# Patient Record
Sex: Male | Born: 1950 | Hispanic: No | Smoking: Former smoker
Health system: Southern US, Community
[De-identification: ages and names within clinical notes are randomized; demographics above are authoritative.]

## PROBLEM LIST (undated history)

## (undated) DIAGNOSIS — Z86010 Personal history of colonic polyps: Secondary | ICD-10-CM

## (undated) DIAGNOSIS — J45909 Unspecified asthma, uncomplicated: Secondary | ICD-10-CM

## (undated) DIAGNOSIS — J309 Allergic rhinitis, unspecified: Secondary | ICD-10-CM

## (undated) DIAGNOSIS — M1990 Unspecified osteoarthritis, unspecified site: Secondary | ICD-10-CM

## (undated) DIAGNOSIS — L309 Dermatitis, unspecified: Secondary | ICD-10-CM

## (undated) HISTORY — DX: Allergic rhinitis, unspecified: J30.9

## (undated) HISTORY — DX: Personal history of colonic polyps: Z86.010

## (undated) HISTORY — DX: Unspecified asthma, uncomplicated: J45.909

## (undated) HISTORY — PX: JOINT REPLACEMENT: SHX530

## (undated) HISTORY — PX: PROSTATECTOMY: SHX69

## (undated) HISTORY — DX: Dermatitis, unspecified: L30.9

## (undated) HISTORY — DX: Unspecified osteoarthritis, unspecified site: M19.90

---

## 1997-10-16 ENCOUNTER — Ambulatory Visit (HOSPITAL_COMMUNITY): Admission: RE | Admit: 1997-10-16 | Discharge: 1997-10-17 | Payer: Self-pay | Admitting: Sports Medicine

## 1997-10-30 ENCOUNTER — Ambulatory Visit (HOSPITAL_COMMUNITY): Admission: RE | Admit: 1997-10-30 | Discharge: 1997-10-30 | Payer: Self-pay | Admitting: Sports Medicine

## 1997-11-16 ENCOUNTER — Ambulatory Visit (HOSPITAL_COMMUNITY): Admission: RE | Admit: 1997-11-16 | Discharge: 1997-11-16 | Payer: Self-pay

## 1997-11-23 ENCOUNTER — Ambulatory Visit (HOSPITAL_COMMUNITY): Admission: RE | Admit: 1997-11-23 | Discharge: 1997-11-23 | Payer: Self-pay | Admitting: Sports Medicine

## 1998-06-02 ENCOUNTER — Encounter: Payer: Self-pay | Admitting: Emergency Medicine

## 1998-06-02 ENCOUNTER — Emergency Department (HOSPITAL_COMMUNITY): Admission: EM | Admit: 1998-06-02 | Discharge: 1998-06-02 | Payer: Self-pay | Admitting: Emergency Medicine

## 1999-08-09 ENCOUNTER — Ambulatory Visit (HOSPITAL_COMMUNITY): Admission: RE | Admit: 1999-08-09 | Discharge: 1999-08-09 | Payer: Self-pay | Admitting: *Deleted

## 2003-07-26 ENCOUNTER — Observation Stay (HOSPITAL_COMMUNITY): Admission: EM | Admit: 2003-07-26 | Discharge: 2003-07-27 | Payer: Self-pay | Admitting: Emergency Medicine

## 2003-08-05 ENCOUNTER — Observation Stay (HOSPITAL_COMMUNITY): Admission: EM | Admit: 2003-08-05 | Discharge: 2003-08-06 | Payer: Self-pay | Admitting: Emergency Medicine

## 2004-11-18 ENCOUNTER — Encounter: Admission: RE | Admit: 2004-11-18 | Discharge: 2004-11-18 | Payer: Self-pay | Admitting: Emergency Medicine

## 2005-04-23 ENCOUNTER — Encounter: Admission: RE | Admit: 2005-04-23 | Discharge: 2005-04-23 | Payer: Self-pay | Admitting: Emergency Medicine

## 2005-06-16 ENCOUNTER — Ambulatory Visit: Payer: Self-pay | Admitting: Gastroenterology

## 2005-07-24 ENCOUNTER — Ambulatory Visit: Payer: Self-pay | Admitting: Gastroenterology

## 2005-07-27 ENCOUNTER — Encounter (INDEPENDENT_AMBULATORY_CARE_PROVIDER_SITE_OTHER): Payer: Self-pay | Admitting: Specialist

## 2005-07-27 ENCOUNTER — Ambulatory Visit (HOSPITAL_COMMUNITY): Admission: RE | Admit: 2005-07-27 | Discharge: 2005-07-27 | Payer: Self-pay | Admitting: Gastroenterology

## 2005-08-25 ENCOUNTER — Ambulatory Visit: Payer: Self-pay | Admitting: Gastroenterology

## 2006-04-19 ENCOUNTER — Emergency Department (HOSPITAL_COMMUNITY): Admission: EM | Admit: 2006-04-19 | Discharge: 2006-04-20 | Payer: Self-pay | Admitting: Emergency Medicine

## 2006-04-20 ENCOUNTER — Inpatient Hospital Stay (HOSPITAL_COMMUNITY): Admission: EM | Admit: 2006-04-20 | Discharge: 2006-04-27 | Payer: Self-pay | Admitting: Emergency Medicine

## 2006-04-30 ENCOUNTER — Emergency Department (HOSPITAL_COMMUNITY): Admission: EM | Admit: 2006-04-30 | Discharge: 2006-05-01 | Payer: Self-pay | Admitting: *Deleted

## 2006-07-02 ENCOUNTER — Encounter: Admission: RE | Admit: 2006-07-02 | Discharge: 2006-07-02 | Payer: Self-pay | Admitting: Emergency Medicine

## 2006-07-13 ENCOUNTER — Ambulatory Visit (HOSPITAL_COMMUNITY): Admission: RE | Admit: 2006-07-13 | Discharge: 2006-07-13 | Payer: Self-pay | Admitting: Neurology

## 2006-10-05 ENCOUNTER — Emergency Department (HOSPITAL_COMMUNITY): Admission: EM | Admit: 2006-10-05 | Discharge: 2006-10-05 | Payer: Self-pay | Admitting: Emergency Medicine

## 2006-11-01 ENCOUNTER — Ambulatory Visit (HOSPITAL_COMMUNITY): Admission: RE | Admit: 2006-11-01 | Discharge: 2006-11-01 | Payer: Self-pay | Admitting: Emergency Medicine

## 2007-02-06 ENCOUNTER — Emergency Department (HOSPITAL_COMMUNITY): Admission: EM | Admit: 2007-02-06 | Discharge: 2007-02-06 | Payer: Self-pay | Admitting: Emergency Medicine

## 2007-02-10 ENCOUNTER — Encounter: Admission: RE | Admit: 2007-02-10 | Discharge: 2007-02-10 | Payer: Self-pay | Admitting: Emergency Medicine

## 2007-11-16 ENCOUNTER — Other Ambulatory Visit: Admission: RE | Admit: 2007-11-16 | Discharge: 2007-11-16 | Payer: Self-pay | Admitting: Otolaryngology

## 2008-03-28 ENCOUNTER — Encounter: Admission: RE | Admit: 2008-03-28 | Discharge: 2008-03-28 | Payer: Self-pay | Admitting: Emergency Medicine

## 2009-04-19 ENCOUNTER — Ambulatory Visit: Payer: Self-pay | Admitting: Internal Medicine

## 2009-04-19 DIAGNOSIS — M1990 Unspecified osteoarthritis, unspecified site: Secondary | ICD-10-CM

## 2009-04-19 DIAGNOSIS — J309 Allergic rhinitis, unspecified: Secondary | ICD-10-CM | POA: Insufficient documentation

## 2009-04-19 DIAGNOSIS — J45909 Unspecified asthma, uncomplicated: Secondary | ICD-10-CM

## 2009-04-19 DIAGNOSIS — Z860100 Personal history of colon polyps, unspecified: Secondary | ICD-10-CM

## 2009-04-19 HISTORY — DX: Personal history of colonic polyps: Z86.010

## 2009-04-19 HISTORY — DX: Personal history of colon polyps, unspecified: Z86.0100

## 2009-04-19 HISTORY — DX: Unspecified osteoarthritis, unspecified site: M19.90

## 2009-04-19 HISTORY — DX: Allergic rhinitis, unspecified: J30.9

## 2009-04-19 HISTORY — DX: Unspecified asthma, uncomplicated: J45.909

## 2009-04-19 LAB — CONVERTED CEMR LAB
ALT: 37 units/L (ref 0–53)
AST: 31 units/L (ref 0–37)
Albumin: 4 g/dL (ref 3.5–5.2)
Alkaline Phosphatase: 69 units/L (ref 39–117)
BUN: 9 mg/dL (ref 6–23)
Basophils Relative: 0.8 % (ref 0.0–3.0)
Chloride: 102 meq/L (ref 96–112)
Eosinophils Relative: 2.3 % (ref 0.0–5.0)
GFR calc non Af Amer: 81.28 mL/min (ref >60)
HCT: 50.8 % (ref 39.0–52.0)
HDL: 37.5 mg/dL — ABNORMAL LOW (ref >39.00)
Hemoglobin: 17.1 g/dL — ABNORMAL HIGH (ref 13.0–17.0)
Lymphs Abs: 2.1 10*3/uL (ref 0.7–4.0)
MCV: 92 fL (ref 78.0–100.0)
Monocytes Absolute: 0.5 10*3/uL (ref 0.1–1.0)
Monocytes Relative: 9.1 % (ref 3.0–12.0)
Neutro Abs: 2.6 10*3/uL (ref 1.4–7.7)
PSA: 3.54 ng/mL (ref 0.10–4.00)
Platelets: 289 10*3/uL (ref 150.0–400.0)
Potassium: 4.7 meq/L (ref 3.5–5.1)
RBC: 5.52 M/uL (ref 4.22–5.81)
Sodium: 140 meq/L (ref 135–145)
TSH: 0.32 microintl units/mL — ABNORMAL LOW (ref 0.35–5.50)
Total Protein: 8.4 g/dL — ABNORMAL HIGH (ref 6.0–8.3)
VLDL: 19.8 mg/dL (ref 0.0–40.0)
WBC: 5.3 10*3/uL (ref 4.5–10.5)

## 2009-04-29 ENCOUNTER — Telehealth: Payer: Self-pay | Admitting: Internal Medicine

## 2009-06-03 ENCOUNTER — Ambulatory Visit: Payer: Self-pay | Admitting: Internal Medicine

## 2009-06-03 DIAGNOSIS — M542 Cervicalgia: Secondary | ICD-10-CM

## 2009-09-05 ENCOUNTER — Ambulatory Visit: Payer: Self-pay | Admitting: Internal Medicine

## 2009-10-01 ENCOUNTER — Telehealth: Payer: Self-pay

## 2009-10-07 ENCOUNTER — Telehealth: Payer: Self-pay | Admitting: Internal Medicine

## 2010-02-20 ENCOUNTER — Ambulatory Visit: Payer: Self-pay | Admitting: Internal Medicine

## 2010-04-15 ENCOUNTER — Telehealth: Payer: Self-pay | Admitting: Internal Medicine

## 2010-06-09 ENCOUNTER — Telehealth: Payer: Self-pay | Admitting: Internal Medicine

## 2010-06-16 ENCOUNTER — Ambulatory Visit: Payer: Self-pay | Admitting: Internal Medicine

## 2010-06-16 ENCOUNTER — Encounter: Payer: Self-pay | Admitting: Internal Medicine

## 2010-06-16 DIAGNOSIS — M549 Dorsalgia, unspecified: Secondary | ICD-10-CM | POA: Insufficient documentation

## 2010-06-16 DIAGNOSIS — R079 Chest pain, unspecified: Secondary | ICD-10-CM

## 2010-07-31 NOTE — Assessment & Plan Note (Signed)
Summary: ?flu/njr   Vital Signs:  Patient profile:   60 year old male Weight:      219 pounds Temp:     100.5 degrees F oral BP sitting:   130 / 88  (right arm) Cuff size:   regular  Vitals Entered By: Duard Brady LPN (September 05, 2009 3:24 PM) CC: c/o productive cough, congestion, headache, body aches - on Z-pack rx'd by dentist 2 days ago - for cold sx and dental procedure. Is Patient Diabetic? No   CC:  c/o productive cough, congestion, headache, and body aches - on Z-pack rx'd by dentist 2 days ago - for cold sx and dental procedure.Marland Kitchen  History of Present Illness: 60 year old patient with a history of asthma.  He presents with a 4-day history of increasing chest congestion, cough, and sore throat.  No fever or chills.  He was seen by his dentist earlier in the week for a tooth extraction and started azithromycin yesterday.  No low wheezing or sputum production.  No shortness of breath  Preventive Screening-Counseling & Management  Alcohol-Tobacco     Smoking Status: current  Allergies: 1)  ! * Horse Serum  Past History:  Past Medical History: Reviewed history from 06/03/2009 and no changes required. Asthma Colonic polyps, hx of Osteoarthritis Allergic rhinitis h/o ETOH  h/o Tobacco  neck pain, with mild left C8 radiculopathy  Social History: Smoking Status:  current  Review of Systems       The patient complains of anorexia, hoarseness, and prolonged cough.  The patient denies fever, weight loss, weight gain, vision loss, decreased hearing, chest pain, syncope, dyspnea on exertion, peripheral edema, headaches, hemoptysis, abdominal pain, melena, hematochezia, severe indigestion/heartburn, hematuria, incontinence, genital sores, muscle weakness, suspicious skin lesions, transient blindness, difficulty walking, depression, unusual weight change, abnormal bleeding, enlarged lymph nodes, angioedema, breast masses, and testicular masses.    Physical Exam  General:   Well-developed,well-nourished,in no acute distress; alert,appropriate and cooperative throughout examination Head:  Normocephalic and atraumatic without obvious abnormalities. No apparent alopecia or balding. Eyes:  No corneal or conjunctival inflammation noted. EOMI. Perrla. Funduscopic exam benign, without hemorrhages, exudates or papilledema. Vision grossly normal. Ears:  External ear exam shows no significant lesions or deformities.  Otoscopic examination reveals clear canals, tympanic membranes are intact bilaterally without bulging, retraction, inflammation or discharge. Hearing is grossly normal bilaterally. Mouth:  pharyngeal erythema.   Neck:  No deformities, masses, or tenderness noted. Lungs:  a few scattered rhonchi.  No active wheezing.normal respiratory effort, no intercostal retractions, and no accessory muscle use.    O2 saturation 95% Heart:  Normal rate and regular rhythm. S1 and S2 normal without gallop, murmur, click, rub or other extra sounds. pulse rate 100 Abdomen:  Bowel sounds positive,abdomen soft and non-tender without masses, organomegaly or hernias noted. Msk:  No deformity or scoliosis noted of thoracic or lumbar spine.   Extremities:  No clubbing, cyanosis, edema, or deformity noted with normal full range of motion of all joints.     Impression & Recommendations:  Problem # 1:  ASTHMA (ICD-493.90)  His updated medication list for this problem includes:    Proair Hfa 108 (90 Base) Mcg/act Aers (Albuterol sulfate) ..... Use as needed  Problem # 2:  BRONCHITIS-ACUTE (ICD-466.0)  His updated medication list for this problem includes:    Proair Hfa 108 (90 Base) Mcg/act Aers (Albuterol sulfate) ..... Use as needed    Hydrocodone-homatropine 5-1.5 Mg/52ml Syrp (Hydrocodone-homatropine) .Marland Kitchen... 1 teaspoon every 6 hours  as needed for cough  Complete Medication List: 1)  Proair Hfa 108 (90 Base) Mcg/act Aers (Albuterol sulfate) .... Use as needed 2)  Naproxen 500 Mg  Tabs (Naproxen) .... As needed 3)  Hydroxyzine Hcl 25 Mg Tabs (Hydroxyzine hcl) .... As needed for itching 4)  Tramadol Hcl 50 Mg Tabs (Tramadol hcl) .... One every 6 hours as needed for pain 5)  Hydrocodone-homatropine 5-1.5 Mg/19ml Syrp (Hydrocodone-homatropine) .Marland Kitchen.. 1 teaspoon every 6 hours as needed for cough  Patient Instructions: 1)  Get plenty of rest, drink lots of clear liquids, and use Tylenol or Ibuprofen for fever and comfort. Return in 7-10 days if you're not better:sooner if you're feeling worse. in the 2)  Take your antibiotic as prescribed until ALL of it is gone, but stop if you develop a rash or swelling and contact our office as soon as possible. Prescriptions: HYDROCODONE-HOMATROPINE 5-1.5 MG/5ML SYRP (HYDROCODONE-HOMATROPINE) 1 teaspoon every 6 hours as needed for cough  #6 oz x 0   Entered and Authorized by:   Gordy Savers  MD   Signed by:   Gordy Savers  MD on 09/05/2009   Method used:   Print then Give to Patient   RxID:   [id number]

## 2010-07-31 NOTE — Progress Notes (Signed)
Summary: refill hydroxyzine  Phone Note Call from Patient   Caller: Patient  [phone] Reason for Call: Talk to Doctor Summary of Call: Pt called to req a refill on med: Hydroxyzine Hcl 25 Mg .... Pt adv that Rx can be sent to [facility] - [address]...Marland KitchenMarland KitchenPt adv that he sees Dr Kirtland Bouchard on a regular basis and only sees Dr Joseph Art intermittently / as needed.... Pt would like to see if Dr Kirtland Bouchard will start maintaining this med for him instead of pt having to go see a specialist and pay a higher co-pay each time he needs a refill on this med....[facility] is also closer to where the patient lives.  Can you advise?  Need for OV?  Pt can be reached at [phone] with any questions or concerns.  Initial call taken by: Debbra Riding,  October 07, 2009 11:51 AM  Follow-up for Phone Call        OK for RF  #100  25 mg Follow-up by: Gordy Savers  MD,  October 07, 2009 12:33 PM    Prescriptions: HYDROXYZINE HCL 25 MG TABS (HYDROXYZINE HCL) as needed for itching  #100 x 1   Entered by:   Duard Brady LPN   Authorized by:   Gordy Savers  MD   Signed by:   Duard Brady LPN on 30/86/5784   Method used:   Electronically to        [facility] [address] (retail)       [address]       [place], [place]  [address]       Ph: [phone]       Fax: [phone]   RxID:   [id number]  efilled to [facility] - attempt to call pt - ans mach - left msg. KIK

## 2010-07-31 NOTE — Assessment & Plan Note (Signed)
Summary: fup on meds//ccm   Vital Signs:  Newman profile:   60 year old male Weight:      219 pounds Temp:     98.2 degrees F oral BP sitting:   130 / 80  (left arm) Cuff size:   regular  Vitals Entered By: Kathrynn Speed CMA (February 20, 2010 3:24 PM) CC: fu up on meds, requesting Rx for Clalis & Triamcinolone other docs had prescribed in past,,src Is Newman Diabetic? No   CC:  fu up on meds, requesting Rx for Clalis & Triamcinolone other docs had prescribed in past, , and src.  History of Present Illness: [name] who is seen today for follow-up.  He has a history of asthma.  He has been tobacco free for 16 days and this has been quite stable.  catheter he is requesting a refill on his medications  Current Medications (verified): 1)  Proair Hfa 108 (90 Base) Mcg/act Aers (Albuterol Sulfate) .... Use As Needed 2)  Hydroxyzine Hcl 25 Mg Tabs (Hydroxyzine Hcl) .... As Needed For Itching  Allergies (verified): 1)  ! * Horse Serum  Past History:  Past Medical History: Asthma Colonic polyps, hx of Osteoarthritis Allergic rhinitis h/o ETOH  h/o Tobacco  neck pain, with mild left C8 radiculopathy eczema  Physical Exam  General:  overweight-appearing.  normal blood pressureoverweight-appearing.   Head:  Normocephalic and atraumatic without obvious abnormalities. No apparent alopecia or balding. Mouth:  Oral mucosa and oropharynx without lesions or exudates.  Teeth in good repair. Neck:  No deformities, masses, or tenderness noted. Lungs:  Normal respiratory effort, chest expands symmetrically. Lungs are clear to auscultation, no crackles or wheezes. Skin:  eczema stable   Impression & Recommendations:  Problem # 1:  NECK PAIN, LEFT (ICD-723.1) continues to have mild paresthesias  of all his left fourth and fifth fingers  Problem # 2:  ASTHMA (ICD-493.90)  His updated medication list for this problem includes:    Proair Hfa 108 (90 Base) Mcg/act Aers  (Albuterol sulfate) ..... Use as needed  His updated medication list for this problem includes:    Proair Hfa 108 (90 Base) Mcg/act Aers (Albuterol sulfate) ..... Use as needed  Complete Medication List: 1)  Proair Hfa 108 (90 Base) Mcg/act Aers (Albuterol sulfate) .... Use as needed 2)  Hydroxyzine Hcl 25 Mg Tabs (Hydroxyzine hcl) .... As needed for itching 3)  Triamcinolone Acetonide 0.1 % Crea (Triamcinolone acetonide) .... Used twice daily as directed 4)  Cialis 20 Mg Tabs (Tadalafil) .... One daily as directed  Newman Instructions: 1)  Please schedule a follow-up appointment in 6 months. 2)  Limit your Sodium (Salt) to less than 2 grams a day(slightly less than 1/2 a teaspoon) to prevent fluid retention, swelling, or worsening of symptoms. 3)  It is important that you exercise regularly at least 20 minutes 5 times a week. If you develop chest pain, have severe difficulty breathing, or feel very tired , stop exercising immediately and seek medical attention. Prescriptions: CIALIS 20 MG TABS (TADALAFIL) one daily as directed  #6 x 6   Entered and Authorized by:   Gordy Savers  MD   Signed by:   Gordy Savers  MD on 02/20/2010   Method used:   Print then Give to Newman   RxID:   [id number] TRIAMCINOLONE ACETONIDE 0.1 % CREA (TRIAMCINOLONE ACETONIDE) used twice daily as directed  #jar x 4   Entered and Authorized by:   Gordy Savers  MD   Signed by:   Gordy Savers  MD on 02/20/2010   Method used:   Print then Give to Newman   RxID:   [id number] PROAIR HFA 108 (90 BASE) MCG/ACT AERS (ALBUTEROL SULFATE) use as needed  #3 x 3   Entered and Authorized by:   Gordy Savers  MD   Signed by:   Gordy Savers  MD on 02/20/2010   Method used:   Print then Give to Newman   RxID:   [id number]

## 2010-07-31 NOTE — Miscellaneous (Signed)
Summary: med list update  Clinical Lists Changes  Medications: Removed medication of TRAMADOL HCL 50 MG TABS (TRAMADOL HCL) one every 6 hours as needed for pain Removed medication of NAPROXEN 500 MG TABS (NAPROXEN) as needed

## 2010-07-31 NOTE — Progress Notes (Signed)
Summary: Pt req TRIAMCINOLONE ACETONIDE oinment in large jar  Phone Note Call from Patient Call back at Home Phone [phone]   Caller: Patient Summary of Call: Pt says that the TRIAMCINOLONE ACETONIDE med, should be an ointment not a cream. Pt says oinment comes in large jar. Pls call in ointment to [facility] [phone] Initial call taken by: Lucy Antigua,  April 15, 2010 4:31 PM    Prescriptions: TRIAMCINOLONE ACETONIDE 0.1 % CREA (TRIAMCINOLONE ACETONIDE) used twice daily as directed  #jar x 2   Entered by:   Duard Brady LPN   Authorized by:   Gordy Savers  MD   Signed by:   Duard Brady LPN on 29/56/2130   Method used:   Faxed to ...       [facility]  [phone][facility] (retail)       [address] [place], [place]  [address]       Ph: [phone] or [phone]       Fax: [phone]   RxID:   [id number]

## 2010-07-31 NOTE — Assessment & Plan Note (Signed)
Summary: chest muscle discomfort/njr   Vital Signs:  Patient profile:   60 year old male Weight:      226 pounds Temp:     98.1 degrees F oral BP sitting:   130 / 80  (right arm) Cuff size:   large  Vitals Entered By: Duard Brady LPN (June 16, 2010 10:46 AM) CC: chest pain Is Patient Diabetic? No   CC:  chest pain.  History of Present Illness: 60 -year-old patient, who presents with a one-week history of pain is maximal just medial to his right scapula.  There is some radiation of the pain to the neck and anterior chest area.  Pain is aggravated by movement and he is especially bothered at night when his land supine.  He states he has had similar episodes since early adulthood.  He is a history of asthma, which has been stable  Allergies: 1)  ! * Horse Serum  Past History:  Past Medical History: Reviewed history from 02/20/2010 and no changes required. Asthma Colonic polyps, hx of Osteoarthritis Allergic rhinitis h/o ETOH  h/o Tobacco  neck pain, with mild left C8 radiculopathy eczema  Physical Exam  General:  Well-developed,well-nourished,in no acute distress; alert,appropriate and cooperative throughout examination Head:  Normocephalic and atraumatic without obvious abnormalities. No apparent alopecia or balding. Mouth:  Oral mucosa and oropharynx without lesions or exudates.  Teeth in good repair. Neck:  No deformities, masses, or tenderness noted. Lungs:  Normal respiratory effort, chest expands symmetrically. Lungs are clear to auscultation, no crackles or wheezes. Heart:  Normal rate and regular rhythm. S1 and S2 normal without gallop, murmur, click, rub or other extra sounds. Msk:  tenderness was noted along the needle border of the right scapula.  There did appear to be a soft tissue fullness.  Unclear whether this was related to muscle spasm or possibly a sebaceous cyst   Impression & Recommendations:  Problem # 1:  BACK PAIN, RIGHT  (ICD-724.5)  His updated medication list for this problem includes:    Cyclobenzaprine Hcl 10 Mg Tabs (Cyclobenzaprine hcl) ..... One every 8 hours as needed for pain    Hydrocodone-acetaminophen 5-500 Mg Tabs (Hydrocodone-acetaminophen) ..... One every 6 hours as needed for pain  His updated medication list for this problem includes:    Cyclobenzaprine Hcl 10 Mg Tabs (Cyclobenzaprine hcl) ..... One every 8 hours as needed for pain    Hydrocodone-acetaminophen 5-500 Mg Tabs (Hydrocodone-acetaminophen) ..... One every 6 hours as needed for pain  Problem # 2:  ASTHMA (ICD-493.90)  His updated medication list for this problem includes:    Proair Hfa 108 (90 Base) Mcg/act Aers (Albuterol sulfate) ..... Use as needed  His updated medication list for this problem includes:    Proair Hfa 108 (90 Base) Mcg/act Aers (Albuterol sulfate) ..... Use as needed  Complete Medication List: 1)  Proair Hfa 108 (90 Base) Mcg/act Aers (Albuterol sulfate) .... Use as needed 2)  Hydroxyzine Hcl 25 Mg Tabs (Hydroxyzine hcl) .... As needed for itching 3)  Triamcinolone Acetonide 0.1 % Oint (Triamcinolone acetonide) .... Use two times a day 4)  Cialis 20 Mg Tabs (Tadalafil) .... One daily as directed 5)  Cyclobenzaprine Hcl 10 Mg Tabs (Cyclobenzaprine hcl) .... One every 8 hours as needed for pain 6)  Hydrocodone-acetaminophen 5-500 Mg Tabs (Hydrocodone-acetaminophen) .... One every 6 hours as needed for pain  Other Orders: EKG w/ Interpretation (93000)  Patient Instructions: 1)  You may move around but avoid painful motions. Apply  heat  to sore area for 20 minutes 3-4 times a day for 2-3 days. 2)  Take 400-600mg  of Ibuprofen (Advil, Motrin) with food every 4-6 hours as needed for relief of pain or comfort of fever. 3)  Please schedule a follow-up appointment in 6 months for annual exam  Prescriptions: HYDROCODONE-ACETAMINOPHEN 5-500 MG TABS (HYDROCODONE-ACETAMINOPHEN) one every 6 hours as needed for pain   #30 x 0   Entered and Authorized by:   Gordy Savers  MD   Signed by:   Gordy Savers  MD on 06/16/2010   Method used:   Print then Give to Patient   RxID:   [id number] CYCLOBENZAPRINE HCL 10 MG TABS (CYCLOBENZAPRINE HCL) one every 8 hours as needed for pain  #30 x 0   Entered and Authorized by:   Gordy Savers  MD   Signed by:   Gordy Savers  MD on 06/16/2010   Method used:   Print then Give to Patient   RxID:   [id number]    Orders Added: 1)  EKG w/ Interpretation [93000] 2)  Est. Patient Level III [99242]  Appended Document: chest muscle discomfort/njr     Allergies: 1)  ! * Horse Serum   Complete Medication List: 1)  Proair Hfa 108 (90 Base) Mcg/act Aers (Albuterol sulfate) .... Use as needed 2)  Hydroxyzine Hcl 25 Mg Tabs (Hydroxyzine hcl) .... As needed for itching 3)  Triamcinolone Acetonide 0.1 % Oint (Triamcinolone acetonide) .... Use two times a day 4)  Cialis 20 Mg Tabs (Tadalafil) .... One daily as directed 5)  Cyclobenzaprine Hcl 10 Mg Tabs (Cyclobenzaprine hcl) .... One every 8 hours as needed for pain 6)  Hydrocodone-acetaminophen 5-500 Mg Tabs (Hydrocodone-acetaminophen) .... One every 6 hours as needed for pain  Other Orders: Depo- Medrol 80mg  (J1040) Admin of Therapeutic Inj  intramuscular or subcutaneous (68341)   Medication Administration  Injection # 1:    Medication: Depo- Medrol 80mg     Diagnosis: BACK PAIN, RIGHT (ICD-724.5)    Route: IM    Site: R deltoid    Exp Date: 09/2012    Lot #: [id number]    Mfr: Pharmacia    Patient tolerated injection without complications    Given by: Duard Brady LPN (June 16, 2010 11:41 AM)  Orders Added: 1)  Depo- Medrol 80mg  [J1040] 2)  Admin of Therapeutic Inj  intramuscular or subcutaneous [96222]

## 2010-07-31 NOTE — Progress Notes (Signed)
Summary: REQ FOR REFILL (Hydroxyzine) denied  Phone Note Call from Patient   Caller: Patient  [phone] Reason for Call: Refill Medication, Insurance Question Summary of Call: Pt called to req a refill on med: Hydroxyzine Hcl 25 Mg .... Pt adv that Rx can be sent to [facility] - [address]  Pt can be reached at [phone] with any questions or concerns.  Initial call taken by: Debbra Riding,  October 01, 2009 2:52 PM  Follow-up for Phone Call        called pt - Dr. Amador Cunas has never written for this med according to what I see. Not in office till Monday - can ask at that time. Pt indicates that Dr. Joseph Art wrote rx - will call him and try and get it filled. KIK Follow-up by: Duard Brady LPN,  October 01, 2009 3:08 PM

## 2010-07-31 NOTE — Progress Notes (Signed)
Summary: switch cream to ointment?  Phone Note From Pharmacy   Caller: [facility] Summary of Call: triamcinolone acetonide cream rx - pt requesting ointment   please advise Initial call taken by: Duard Brady LPN,  June 09, 2010 3:35 PM    New/Updated Medications: TRIAMCINOLONE ACETONIDE 0.1 % OINT (TRIAMCINOLONE ACETONIDE) use two times a day Prescriptions: TRIAMCINOLONE ACETONIDE 0.1 % OINT (TRIAMCINOLONE ACETONIDE) use two times a day  #large jar x 4   Entered by:   Duard Brady LPN   Authorized by:   Gordy Savers  MD   Signed by:   Duard Brady LPN on 16/03/9603   Method used:   [facility]axed to ...       [facility]  [phone]* (retail)       [address] [place], [place]  [address]       Ph: [phone] or [phone]       Fax: [phone]   RxID:   [id number]  OK  large jar

## 2010-08-14 ENCOUNTER — Telehealth: Payer: Self-pay | Admitting: Internal Medicine

## 2010-08-14 NOTE — Telephone Encounter (Signed)
PT. WANTS TO HAVE AN STD & HIV TEST ADDED TO HIS LABS FOR TOMM. PLEASE CALL HIM IF THIS WILL BE A PROBLEM OR TO CONFIRM

## 2010-08-14 NOTE — Telephone Encounter (Signed)
Ok to add HIV and RPR.

## 2010-08-15 ENCOUNTER — Other Ambulatory Visit (INDEPENDENT_AMBULATORY_CARE_PROVIDER_SITE_OTHER): Payer: 59 | Admitting: Internal Medicine

## 2010-08-15 DIAGNOSIS — Z0000 Encounter for general adult medical examination without abnormal findings: Secondary | ICD-10-CM

## 2010-08-15 LAB — BASIC METABOLIC PANEL
BUN: 10 mg/dL (ref 6–23)
Creatinine, Ser: 1 mg/dL (ref 0.4–1.5)
GFR: 84.82 mL/min (ref >60.00)
Glucose, Bld: 81 mg/dL (ref 70–99)
Potassium: 4.5 mEq/L (ref 3.5–5.1)

## 2010-08-15 LAB — CBC WITH DIFFERENTIAL/PLATELET
Basophils Absolute: 0 10*3/uL (ref 0.0–0.1)
HCT: 49.8 % (ref 39.0–52.0)
Lymphs Abs: 1.6 10*3/uL (ref 0.7–4.0)
Monocytes Absolute: 0.5 10*3/uL (ref 0.1–1.0)
Monocytes Relative: 9.4 % (ref 3.0–12.0)
Neutrophils Relative %: 56.3 % (ref 43.0–77.0)
Platelets: 244 10*3/uL (ref 150.0–400.0)
RDW: 14.6 % (ref 11.5–14.6)
WBC: 5.1 10*3/uL (ref 4.5–10.5)

## 2010-08-15 LAB — LIPID PANEL
Cholesterol: 190 mg/dL (ref 0–200)
LDL Cholesterol: 134 mg/dL — ABNORMAL HIGH (ref 0–99)
Total CHOL/HDL Ratio: 5
Triglycerides: 81 mg/dL (ref 0.0–149.0)
VLDL: 16.2 mg/dL (ref 0.0–40.0)

## 2010-08-15 LAB — POCT URINALYSIS DIPSTICK
Bilirubin, UA: NEGATIVE
Blood, UA: NEGATIVE
Ketones, UA: NEGATIVE
Protein, UA: NEGATIVE
pH, UA: 6

## 2010-08-15 LAB — HEPATIC FUNCTION PANEL
AST: 28 U/L (ref 0–37)
Bilirubin, Direct: 0.1 mg/dL (ref 0.0–0.3)
Total Bilirubin: 0.5 mg/dL (ref 0.3–1.2)

## 2010-08-16 LAB — CHLAMYDIA PROBE AMPLIFICATION, URINE: Chlamydia, Swab/Urine, PCR: NEGATIVE

## 2010-08-16 LAB — GC PROBE AMPLIFICATION, URINE: GC Probe Amp, Urine: NEGATIVE

## 2010-08-21 ENCOUNTER — Ambulatory Visit (INDEPENDENT_AMBULATORY_CARE_PROVIDER_SITE_OTHER): Payer: 59 | Admitting: Internal Medicine

## 2010-08-21 ENCOUNTER — Encounter: Payer: Self-pay | Admitting: Internal Medicine

## 2010-08-21 VITALS — BP 140/76 | HR 90 | Temp 98.3°F | Resp 16 | Ht 71.0 in | Wt 211.0 lb

## 2010-08-21 DIAGNOSIS — M1990 Unspecified osteoarthritis, unspecified site: Secondary | ICD-10-CM

## 2010-08-21 DIAGNOSIS — J309 Allergic rhinitis, unspecified: Secondary | ICD-10-CM

## 2010-08-21 DIAGNOSIS — Z0000 Encounter for general adult medical examination without abnormal findings: Secondary | ICD-10-CM

## 2010-08-21 DIAGNOSIS — J45909 Unspecified asthma, uncomplicated: Secondary | ICD-10-CM

## 2010-08-21 DIAGNOSIS — Z86010 Personal history of colonic polyps: Secondary | ICD-10-CM

## 2010-08-21 MED ORDER — TADALAFIL 20 MG PO TABS: 20.0000 mg | ORAL_TABLET | Freq: Every day | ORAL | Status: DC | PRN

## 2010-08-21 MED ORDER — HYDROCODONE-ACETAMINOPHEN 5-500 MG PO TABS: 1.0000 | ORAL_TABLET | Freq: Four times a day (QID) | ORAL | Status: DC | PRN

## 2010-08-21 MED ORDER — CYCLOBENZAPRINE HCL 10 MG PO TABS: 10.0000 mg | ORAL_TABLET | Freq: Three times a day (TID) | ORAL | Status: DC | PRN

## 2010-08-21 MED ORDER — ALBUTEROL SULFATE HFA 108 (90 BASE) MCG/ACT IN AERS: 2.0000 | INHALATION_SPRAY | Freq: Four times a day (QID) | RESPIRATORY_TRACT | Status: DC | PRN

## 2010-08-21 NOTE — Progress Notes (Signed)
  Subjective:    Patient ID: [name], male    DOB: 09-12-1950, 60 y.o.   MRN: [id number]  HPI   60 year old patient who is seen today for a preventive health examination. He is doing quite well he has history of asthma which has been stable. He has a history of colonic polyps and is due for followup colonoscopy this year. He is followed by Dr. Kinnie Scales other problems include mild osteoarthritis allergic rhinitis and also a history of ongoing tobacco use   Review of Systems  Constitutional: Negative for fever, chills, activity change, appetite change and fatigue.  HENT: Negative for hearing loss, ear pain, congestion, rhinorrhea, sneezing, mouth sores, trouble swallowing, neck pain, neck stiffness, dental problem, voice change, sinus pressure and tinnitus.   Eyes: Negative for photophobia, pain, redness and visual disturbance.  Respiratory: Negative for apnea, cough, choking, chest tightness, shortness of breath and wheezing.   Cardiovascular: Negative for chest pain, palpitations and leg swelling.  Gastrointestinal: Negative for nausea, vomiting, abdominal pain, diarrhea, constipation, blood in stool, abdominal distention, anal bleeding and rectal pain.  Genitourinary: Negative for dysuria, urgency, frequency, hematuria, flank pain, decreased urine volume, discharge, penile swelling, scrotal swelling, difficulty urinating, genital sores and testicular pain.  Musculoskeletal: Negative for myalgias, back pain, joint swelling, arthralgias and gait problem.  Skin: Negative for color change, rash and wound.  Neurological: Negative for dizziness, tremors, seizures, syncope, facial asymmetry, speech difficulty, weakness, light-headedness, numbness and headaches.  Hematological: Negative for adenopathy. Does not bruise/bleed easily.  Psychiatric/Behavioral: Negative for suicidal ideas, hallucinations, behavioral problems, confusion, sleep disturbance, self-injury, dysphoric mood, decreased concentration  and agitation. The patient is not nervous/anxious.        Objective:   Physical Exam  Constitutional: He appears well-developed and well-nourished.  HENT:  Head: Normocephalic and atraumatic.  Right Ear: External ear normal.  Left Ear: External ear normal.  Nose: Nose normal.  Mouth/Throat: Oropharynx is clear and moist.  Eyes: Conjunctivae and EOM are normal. Pupils are equal, round, and reactive to light. No scleral icterus.  Neck: Normal range of motion. Neck supple. No JVD present. No thyromegaly present.  Cardiovascular: Regular rhythm, normal heart sounds and intact distal pulses.  Exam reveals no gallop and no friction rub.   No murmur heard. Pulmonary/Chest: Effort normal and breath sounds normal. No respiratory distress. He has no wheezes. He has no rales. He exhibits no tenderness.  Abdominal: Soft. Bowel sounds are normal. He exhibits no distension and no mass. There is no tenderness.  Genitourinary: Rectum normal, prostate normal and penis normal. Guaiac negative stool.  Musculoskeletal: Normal range of motion. He exhibits no edema and no tenderness.  Lymphadenopathy:    He has no cervical adenopathy.  Neurological: He is alert. He has normal reflexes. No cranial nerve deficit. Coordination normal.  Skin: Skin is warm and dry. No rash noted.  Psychiatric: He has a normal mood and affect. His behavior is normal.          Assessment & Plan:   preventive health examination unremarkable  Ongoing tobacco use  Colonic polyps   Tobacco cessation discussed and encouraged. He will followup with Dr. Kinnie Scales for his colonoscopy return here in one year or when necessary

## 2010-08-21 NOTE — Patient Instructions (Signed)
Limit your sodium (Salt) intake   It is important that you exercise regularly, at least 20 minutes 3 to 4 times per week.  If you develop chest pain or shortness of breath seek  medical attention.  Schedule your colonoscopy to help detect colon cancer.   Return in one year for follow-up  Smoking tobacco is very bad for your health. You should stop smoking immediately.

## 2010-08-22 ENCOUNTER — Encounter: Payer: Self-pay | Admitting: Internal Medicine

## 2010-08-26 ENCOUNTER — Other Ambulatory Visit: Payer: Self-pay

## 2010-08-26 MED ORDER — TRIAMCINOLONE ACETONIDE 0.1 % EX OINT: TOPICAL_OINTMENT | Freq: Two times a day (BID) | CUTANEOUS | Status: DC

## 2010-11-14 NOTE — Discharge Summary (Signed)
NAMEABIGAIL, MARSIGLIA                ACCOUNT NO.:  [id number]   MEDICAL RECORD NO.:  [id number]          PATIENT TYPE:  INP   LOCATION:  3013                         FACILITY:  [facility]   PHYSICIAN:  Danae Orleans. Venetia Maxon, M.D.  DATE OF BIRTH:  Dec 13, 1950   DATE OF ADMISSION:  04/20/2006  DATE OF DISCHARGE:  04/27/2006                                 DISCHARGE SUMMARY   REASON FOR ADMISSION:  Fall with subdural hematoma and scalp laceration.   ADDITIONAL DIAGNOSES:  1. Alcohol abuse.  2. Generalized seizure.  3. Hypertension.  4. Persistent headache.  5. History of hepatitis C.  6. Asthma.   FINAL DIAGNOSES:  1. Fall with subdural hematoma and scalp laceration.  2. Alcohol abuse.  3. Generalized seizure.  4. Hypertension.  5. Persistent headache.  6. History of hepatitis C.  7. Asthma.   </   HISTORY OF PRESENT ILLNESS AND HOSPITAL COURSE:  [name] is a 60-year-  old man who came to the emergency room on April 20, 2006, having fallen  and hit his head.  A head CT demonstrated a right-sided subdural hematoma  which appeared to be acute, and he had a left-sided scalp laceration. While  in the emergency room, I witnessed generalized seizure which ceased when the  patient was given Ativan.  Though the patient denied it, his wife stated  that had a heavy alcohol use history and that he drinks up to a gallon of  gin every few days.   The patient was hypertensive in the emergency room with a blood pressure of  160/112.  He was admitted to the intensive care unit where he was observed  carefully, and repeat head CTs did not show any evidence of progression of  the size of the right-sided subdural hematoma.  The patient was loaded with  Dilantin in the emergency room and did not have any further seizure  activity.  He was given DT prophylaxis with multivitamins, folate and  thiamine and also was given Ativan for DT prophylaxis.  The patient  completed DT prophylaxis and was  improving but continued to complain of the  headaches.   He was given a short course of Decadron, which did not improve his headache,  and subsequently it was felt that, because he had persistently elevated  blood pressures including on October 29 a blood pressure of 141/94,  hospitalists consult was initiated with Dr. Sharon Seller who recommended that he  be treated for the elevated blood pressure and started him on  benazepril/hydrochlorothiazide 10/12.5 mg daily.  Additionally, I counseled  patient on the need for him to stop drinking alcohol and the importance of  preventing seizure and remaining off alcohol.  We also had a social work  consultation regarding this matter, and I also called and spoke with Dr.  Lorenz Coaster about this. I prescribed Percocet 5/325 one every 4-6 hours needed  for pain and also Dilantin 100 mg three times daily for seizure prophylaxis.  The patient was instructed not to engage in alcohol consumption and not to  drive a car.  He is to  follow up with Dr. Lorenz Coaster in 1 week for blood  pressure management and will follow up with me in 2 weeks with a head CT and  a Dilantin level in the office.      Danae Orleans. Venetia Maxon, M.D.  Electronically Signed     JDS/MEDQ  D:  04/27/2006  T:  04/27/2006  Job:  [id number]   cc:   Reuben Likes, M.D.

## 2010-11-14 NOTE — Discharge Summary (Signed)
NAMEBENJAMIN, [name]                            ACCOUNT NO.:  [id number]   MEDICAL RECORD NO.:  [id number]                   PATIENT TYPE:  INP   LOCATION:  3732                                 FACILITY:  [facility]   PHYSICIAN:  Eliseo Gum, M.D.                DATE OF BIRTH:  [birth year]   DATE OF ADMISSION:  08/04/2003  DATE OF DISCHARGE:  08/06/2003                                 DISCHARGE SUMMARY   DISCHARGE DIAGNOSES:  1. Atypical chest pain.  2. Lip lesion etiology unknown.  3. Elevated liver enzymes.  4. Alcohol abuse.   PAST MEDICAL HISTORY:  1. Eczema.  2. Asthma.   DISCHARGE MEDICATIONS:  1. Aspirin 81 mg p.o. daily.  2. Protonix 40 mg daily.  3. Zestril 25 mg q.6h. p.r.n. for itching.   FOLLOW UP APPOINTMENTS:  The patient will follow up on August 22, 2003  with __________ [facility] for Cardiolite stress test  The patient  will also call [facility] outpatient [facility] [phone] of an internal medicine  follow up appointment.   PROCEDURES:  CT of the chest with contrast media of the lower extremities on  August 05, 2003 showed no evidence of PE or other acute process, mild  bibasilar dependent atelectasis, otherwise clear.  No evidence of acute DVD  seen on the lower extremity CT.   CONSULTATIONS:  [facility] was consulted via the telephone.  The  patient will follow up with them as an outpatient.  They were not consulted  for inpatient treatment.   HISTORY OF PRESENT ILLNESS:  [name] is a 60 year old African-American  male with past medical history of eczema, asthma, tobacco and alcohol abuse  who presents complaining of substernal chest pain.  The chest pain started 2  days ago radiating to the back.  It was intermittent more of a supine  position associated with some shortness of breath, but he did have shortness  of breath chronically before this. He had 1 episode of vomiting 2 days ago  that may have started this pain, no blood was noted in  his vomitus.  He  denies cough.  The most exercise that he gets is walking approximately 1  mile to work.  He does not get chest pain with this exertion.   His cardiac risk factors include tobacco abuse and age, but he denies  hypertension, diabetes, or family history.  His cholesterol status is  unknown.   REVIEW OF SYSTEMS:  Positive for some cough, emesis as noted above, no  dysuria, edema, hematochezia, hematemesis, or melena were noted.   SOCIAL HISTORY:  The patient lives in [place] with his wife.  He smokes a  pack of cigarettes a day for the past 4 years.  He quit approximately 1 week  ago. He drinks about a fifth of gin about every 3 days which he has been  doing for about 3 months.  He graduated high school, has 2 years of college  and works at [employer].   FAMILY HISTORY:  His mom is 61 and is healthy.  His dad died of an acute MI  at age 61.  His dad also had diabetes and hypertension.   PHYSICAL EXAMINATION:  VITAL SIGNS:  Temperature 97.5, blood pressure  148/86, pulse 69.  Saturation 94% on room air.  Respirations 20.  GENERAL:  The patient is a well-developed black male sitting beside the bed  in no acute distress.  HEENT:  Pupils are equal round, reacted to light.  Extraocular muscles are  intact.  There is an oral lesion to the lower anterior mucosa which had a  light black area noted.  NECK:  Supple.  No lymphadenopathy.  CARDIOVASCULAR:  Regular rate and rhythm without appreciable murmur, rub, or  gallop.  LUNGS:  Decreased expiratory breath sounds and coarse.  ABDOMEN:  Soft, nontender.  Mild tenderness to palpation of the epigastrium.  Positive bowel sounds.  EXTREMITIES:  No clubbing, cyanosis, or edema with good pulses.   ADMISSION LABS:  Sodium 138, potassium 4, chloride 97, bicarb 24, BUN 15,  creatinine 1.1, glucose 127,  total bilirubin 0.9, AST and ALT were 55 and  42 respectively, alkaline phosphatase 57, albumin 3.7.  The first set of  enzymes  were negative.  Followed by 2 other negative sets of enzymes.  BNP  was less than 30.  Lipase was 26.  LDL was 145.  Triglyceride was 251 and  HDL was 48.  Chest x-ray was normal.  CT scan with results as above.   HOSPITAL COURSE BY PROBLEM:  Problem #1:  ATYPICAL CHEST PAIN.  The patient  was admitted for rule out myocardial infarction with cardiac enzymes.  Also  had a differential of gastritis, GERD, and PUD especially with his history  of alcohol use.  Pancreatitis was the lower end of the differential, but was  ruled out with a negative lipase.  A urine drug screen was checked which was  negative.  A TSH was checked which was normal at 3.019.  The patient was  started on Protonix for possible GI causes especially given his history of  worsening pain on eating.  Because the patient ruled out for a myocardial  infarction; it was decided that he would need further risk stratification  with a Cardiolite stress test. For this reason we called [facility]ology  and set up an outpatient appointment on February 23.  The patient will be  discharged on Protonix and aspirin.  We did check an H. pylori which is  pending.  We will need to follow up on this.   Problem #2:  LIP LESION.  The patient had painless lesion on his lip; and,  with his history of smoking, there was some concern for oral carcinoma and  leukoplakia.  It was decided that if it did not resolve over a period of  time the patient would need a biopsy to rule out cancer given his risk  factors.  This can be done as an outpatient, but it does need to be followed  up.   Problem #3:  ELEVATED LIVER ENZYMES.  When the patient arrived in the  hospital his enzymes were 55 and 42 for his AST and ALT, respectively, as  noted above. His alkaline phosphatase was 57 with a total bilirubin of 0.9.  Given his history of alcohol use, the main differential diagnosis was alcoholic hepatitis, versus viral hepatitis.  An acute viral hepatitis  panel  was checked; and the patient was found to be positive for hepatitis C.  For  this reason the patient will need a hepatitis A and hepatitis B vaccine and  will need to follow up in the Hepatology Clinic to see if he is eligible for  interferon and ribavirin treatment.   Problem #4:  ALCOHOL ABUSE.  The patient was counseled on his use of  alcohol.  He was given vitamins throughout his hospital stay.   DISCHARGE LABS:  Hemoglobin A1c 6.0, hepatitis acute viral panel positive  for hepatitis C.  TSH 3.019.  Sodium 137, potassium 3.7, chloride 103,  bicarb 28, glucose 100, BUN 15, creatinine 1.2, and calcium 8.7.  White  count 5.3, hemoglobin 14.6, platelets 300, lipase 26.  Cardiac enzymes were  negative.  Urine drug screen negative with a profile as above.  LDL 145, HDL  48, TG 251, alcohol less than 5, D-dimmer 0.79, BNP less than 30.                                                Eliseo Gum, M.D.    KC/MEDQ  D:  08/06/2003  T:  08/06/2003  Job:  [id number]

## 2010-11-14 NOTE — Consult Note (Signed)
[name], Newman                ACCOUNT NO.:  [id number]   MEDICAL RECORD NO.:  [id number]          PATIENT TYPE:  INP   LOCATION:  3013                         FACILITY:  [facility]   PHYSICIAN:  Lonia Blood, M.D.DATE OF BIRTH:  [birth year]   DATE OF CONSULTATION:  DATE OF DISCHARGE:                                   CONSULTATION   PRIMARY CARE PHYSICIAN:  Dr. Lorenz Coaster.   REQUESTING PHYSICIAN/ATTENDING:  Danae Orleans. Venetia Maxon, M.D.   REASON FOR CONSULTATION:  Uncontrolled hypertension.   HISTORY OF PRESENT ILLNESS:  [name] is a very pleasant 60 year old  gentleman who was admitted to the service of Dr. Venetia Maxon on April 20, 2006  after he was found to have a subdural hematoma.  The patient to me describes  an 8-week history of severe headaches.  He denies any trauma to the head.  He himself denies significant alcohol abuse. He reports when his headaches  began 8 weeks ago he attempted all over-the-counter pain medications to no  available.  He then reports that he began to drink alcohol in attempts to  ameliorate the pain.  He reports that prior to this he drank only  occasionally, and he vehemently denies an alcohol abuse problem.  Nonetheless, the patient's family noted that he has fallen many times and  struck his head, each time due to a drunken state per their report.  Nonetheless, at the time of his presentation, the patient had a witnessed  fall in his work place and was also noted to have seizure activity.  He was  transferred to the emergency room for evaluation.  He proceeded to seize in  front of the neurosurgeon during his ER evaluation.  He was admitted after  he was found to have a significant subdural hematoma.  Ongoing care has been  by the neurosurgical service.  The patient has improved nicely.  Approximately 36 hours ago, however, the patient experienced a significant  return of his headache.  Work-up has been ongoing by neurosurgery to include  an MRI of  the brain, which is currently pending.  It has also been  appreciated the patient has suffered with elevated blood pressure during his  hospital stay.  Neurosurgery has requested that we evaluate the patient in  hopes that we can address his elevated blood pressure and assist in control  of his problem.  At the time of this evaluation, the patient has been given  an oral pain medication/acetaminophen mixture, which he reports has almost  completely resolved his headache.  Blood pressure remains elevated.  He has  no other complaints at this time.   REVIEW OF SYSTEMS:  Comprehensive review of systems is entirely unremarkable  with the exception of the elements noted in the history of present illness  above.   PAST MEDICAL HISTORY:  1. Hepatitis C positive with no prior outpatient hepatology evaluation.  2. Tobacco abuse in the amount of 1 pack per day.  3. Alcohol abuse - per patient denies, but family reports a gallon of      gin every 2 days.  4. No prior diagnosis of hypertension.  5. Eczema.   OUTPATIENT MEDICATIONS:  The patient is currently on no outpatient  prescription medications.   ALLERGIES:  No known drug allergies.   FAMILY HISTORY:  The patient's mother is alive at 89 and health.  The  patient's father died at age 30 due to acute coronary syndrome and also had  diabetes and hypertension.   SOCIAL HISTORY:  The patient is married.  He lives with his wife.  He works  at [employer].  He has 2 years of college education.   DATA REVIEW:  Potassium is 3.5, which is increased from 3.1 previously.  Sodium, chloride, bicarbonate, BUN and creatinine are normal.  Serum glucose  is mildly elevated at 124, on dextrose containing fluids.  Calcium is 8.8.  CBC is unremarkable.  Coags are essentially normal.   PHYSICAL EXAMINATION:  VITAL SIGNS:  Temperature 98.4, blood pressure 141/94  with a range of 139-160 systolic over 80-100 diastolic over the last 48  hours.  Heart  rate is 65, respiratory rate is 20.  O2 saturations are 97% on  room air.  GENERAL:  Well-developed, well-nourished male in no acute respiratory  distress.  HEENT:  Normocephalic and atraumatic.  Pupils equal, round and reactive to  light and accommodation.  Extraocular muscles intact.  __________  clear.  NECK:  No JVD, no lymphadenopathy.  LUNGS:  Clear to auscultation bilaterally without wheezes or rhonchi.  CARDIOVASCULAR:  Regular rate and rhythm without murmur, gallop, or rub.  Normal S1 and S2.  ABDOMEN:  Nontender, nondistended, soft.  Bowel sounds present.  No  hepatosplenomegaly.  No rebound.  No ascites.  EXTREMITIES:  No significant clubbing, cyanosis, or edema in bilateral lower  extremities.  NEUROLOGIC:  Nonfocal neurologic exam.   RECOMMENDATIONS:  1. Uncontrolled blood pressure.  At this time, the patient's blood      pressure has been consistently elevated during his hospital stay.  I am      comfortable making a diagnosis of uncontrolled hypertension.  It is      quite possible this could be secondary to his ongoing headache and the      stress of acute hospitalization.  Nonetheless, at the present time, I      do not feel that we can afford to not treat his elevated diastolic and      systolic pressures.  I will initiate combined ACE inhibitor and      diuretic therapy.  The patient confirms to me that he does have      insurance that would allow him to obtain these medications on a regular      basis.  It is my recommendation that he be discharged on these same      medications, should he tolerate them.  He will need to follow with his      primary care physician in approximately 1 week.  I have informed the      patient is it quite possible that should his headaches resolve should      he discontinued alcohol use and return to his usual habits, that he     may, in fact, not require long-term blood pressure therapy.  A BMET      will need to be checked again in 1  week to assure the patient is      tolerating this combination therapy.  2. Mild hypokalemia.  The patient is suffering with a mild hypokalemia.  This is likely secondary to decreased p.o. intake during his hospital      stay.  Will correct this prior to initiating his diuretic and ACE      inhibitor therapy.  3. Alcohol abuse.  I have counseled the patient on the multiple      deleterious effects of ongoing alcohol abuse.  He vehemently denies use      of alcohol, as noted above.  Beyond the counseling that I have afforded      him, there is little more to offer if the patient continues to deny      that he has a problem.  This will need to be monitored by his family      and his outpatient physician.  4. Tobacco abuse.  I have advised the patient of the multiple deleterious      effects of ongoing tobacco use.  He reports to me that he will no      longer smoke.  I have congratulated him on this, and have encouraged      him to stick to this.   Thank you very much for your consultation of this gentleman.  We are happy  to follow along with you during this hospitalization.      Lonia Blood, M.D.  Electronically Signed     JTM/MEDQ  D:  04/26/2006  T:  04/26/2006  Job:  [id number]

## 2010-11-14 NOTE — Procedures (Signed)
CLINICAL INFORMATION:  This 60 year old black male has had two episodes of seizure activity and  has complained of severe headaches.  He has had previously 2 blood clots  on the brain which apparently are not present this time.   TECHNICAL DESCRIPTION:  This EEG was recorded during the awake state.  The background activity shows 12-13 Hz rhythms with higher amplitudes  seen in the posterior head regions bilaterally.  There is much low-  voltage fast beta activity present during portions of this EEG and also  muscle artifact as well.  No focal asymmetries are present.  There is no  evidence of any epileptiform activity, and there is no evidence of any  stage II sleep seen.  Photic stimulation was performed which did not  produce driving response.  Hyperventilation testing produced no  significant abnormalities.   IMPRESSION:  This is a normal EEG during the awake and drowsy states.           ______________________________  Genene Churn. Sandria Manly, M.D.     ZOX:WRUE  D:  07/13/2006 16:34:24  T:  07/14/2006 11:46:11  Job #:  [id number]

## 2010-11-14 NOTE — H&P (Signed)
NAMERONIT, MARCZAK                ACCOUNT NO.:  [id number]   MEDICAL RECORD NO.:  [id number]          PATIENT TYPE:  INP   LOCATION:  3112                         FACILITY:  [facility]   PHYSICIAN:  Danae Orleans. Venetia Maxon, M.D.  DATE OF BIRTH:  [birth year]   DATE OF ADMISSION:  04/20/2006  DATE OF DISCHARGE:                                HISTORY & PHYSICAL   REASON FOR ADMISSION:  Fall, in which the patient struck his head, had a  seizure, and was found to have a subdural hematoma.   HISTORY OF PRESENT ILLNESS:  [name] is a 60 year old right-handed man  with what he describes is a 96-hour history of headache, which he initially  thought was a sinus problem.  He came to the emergency room yesterday  evening and left before exam, secondary to a long wait.  He went back to  work; he works at [employer].  While at work, the patient had a twitching in  his left face, fell, and hit his head and had a left occipital scalp  laceration.  Head CT demonstrates a small right-sided acute to subacute  subdural hematoma.  In the emergency room, the patient was awake, alert, and  conversant, but then following his examination, he developed left-sided hand  twitching and numbness, which then moved up his left arm, ascended into the  body, and then he had a generalized seizure with loss of consciousness,  which I witnessed.  The seizure activity was broke with 2 mg of Ativan IV.   PAST MEDICAL HISTORY:  Significant for hepatitis C and asthma.  According to  the patient's wife, he is a heavy smoker and heavy drinker of alcoholic  beverages, and he drinks about a gallon of gin every few days.   ALLERGIES:  HE HAS NO KNOWN DRUG ALLERGIES.   MEDICATIONS:  He is on no scheduled medications.   PHYSICAL EXAMINATION:  VITAL SIGNS:  Temperature 99.4, pulse of 127, with a  respiratory rate of 24, saturation is 93%, and his blood pressure is  160/112.  GENERAL:  On initial examination, the patient is awake,  alert, and  conversant.  He speaks, states his name, his age, and events over the last  few days.  HEENT:  Pupils are equal, round, and reactive to light.  Extraocular  movements are intact.  He has a laceration of the tip of his tongue and his  left occiput, which is not actively bleeding.  Tympanic membranes are  negative.  EXTREMITIES:  He moves all extremities with full power and has no pronator  drift.  He has no numbness in his upper or lower extremities.  CHEST:  Clear to auscultation.  HEART:  Regular rate and rhythm without murmur.  ABDOMEN:  Obese with active bowel sounds, no hepatosplenomegaly appreciated.  EXTREMITIES:  No edema, clubbing, or cyanosis of the lower extremities.  Reflexes are symmetric in the upper and lower extremities without pathologic  reflexes.  NEUROLOGICAL:  During the examination, the patient had a generalized  seizure, which started in his left arm and moved to face and to  his body.   IMPRESSION:  [name] is a 60 year old man who is a heavy drinker and  smoker, with a headache and new onset of seizures and right-sided subdural  hematoma.  On further discussion, the patient does not recall any trauma to  his head; however, on further discussion with the patient's wife, she says  he becomes drunk and then will fall and hit his head and frequently falls  and strikes his head, and I suspect that putting everything together, he had  a fall, hit his head in which he developed a subdural, and subsequently  developed headaches, and then later, had a seizure from the subdural  hematoma, which resulted in his fall at work and laceration to his scalp.  The patient is to be admitted to the neuro intensive care unit to control  his seizures.  We will observe, make sure he does not have more problems  with hypertension.  He may require drainage of the subdural hematoma.  We  will need to repeat a head CT in the morning.  He will also need some  alcohol abuse  counseling and he will need Ativan protocol for DTs.      Danae Orleans. Venetia Maxon, M.D.  Electronically Signed     JDS/MEDQ  D:  04/20/2006  T:  04/20/2006  Job:  [id number]

## 2011-02-13 ENCOUNTER — Encounter: Payer: Self-pay | Admitting: Internal Medicine

## 2011-04-15 ENCOUNTER — Other Ambulatory Visit: Payer: Self-pay | Admitting: Internal Medicine

## 2011-09-28 ENCOUNTER — Other Ambulatory Visit: Payer: Self-pay | Admitting: Internal Medicine

## 2011-10-05 ENCOUNTER — Other Ambulatory Visit (INDEPENDENT_AMBULATORY_CARE_PROVIDER_SITE_OTHER): Payer: 59

## 2011-10-05 DIAGNOSIS — Z0000 Encounter for general adult medical examination without abnormal findings: Secondary | ICD-10-CM

## 2011-10-05 LAB — POCT URINALYSIS DIPSTICK
Blood, UA: NEGATIVE
Glucose, UA: NEGATIVE
Spec Grav, UA: >=1.03
Urobilinogen, UA: 0.2

## 2011-10-05 LAB — CBC WITH DIFFERENTIAL/PLATELET
Basophils Relative: 1.1 % (ref 0.0–3.0)
HCT: 48 % (ref 39.0–52.0)
Hemoglobin: 15.5 g/dL (ref 13.0–17.0)
Lymphocytes Relative: 36.3 % (ref 12.0–46.0)
Lymphs Abs: 2.1 10*3/uL (ref 0.7–4.0)
MCHC: 32.4 g/dL (ref 30.0–36.0)
Monocytes Relative: 11.8 % (ref 3.0–12.0)
Neutro Abs: 2.7 10*3/uL (ref 1.4–7.7)
RBC: 5.11 Mil/uL (ref 4.22–5.81)

## 2011-10-05 LAB — BASIC METABOLIC PANEL
CO2: 27 mEq/L (ref 19–32)
Calcium: 9.4 mg/dL (ref 8.4–10.5)
Potassium: 4.5 mEq/L (ref 3.5–5.1)
Sodium: 139 mEq/L (ref 135–145)

## 2011-10-05 LAB — HEPATIC FUNCTION PANEL
AST: 31 U/L (ref 0–37)
Albumin: 3.9 g/dL (ref 3.5–5.2)
Alkaline Phosphatase: 63 U/L (ref 39–117)
Total Protein: 7.5 g/dL (ref 6.0–8.3)

## 2011-10-05 LAB — LIPID PANEL
Cholesterol: 196 mg/dL (ref 0–200)
LDL Cholesterol: 133 mg/dL — ABNORMAL HIGH (ref 0–99)
Triglycerides: 101 mg/dL (ref 0.0–149.0)
VLDL: 20.2 mg/dL (ref 0.0–40.0)

## 2011-10-05 LAB — TSH: TSH: 0.67 u[IU]/mL (ref 0.35–5.50)

## 2011-10-05 LAB — PSA: PSA: 2.91 ng/mL (ref 0.10–4.00)

## 2011-10-06 LAB — HIV ANTIBODY (ROUTINE TESTING W REFLEX): HIV: NONREACTIVE

## 2011-10-12 ENCOUNTER — Ambulatory Visit (INDEPENDENT_AMBULATORY_CARE_PROVIDER_SITE_OTHER): Payer: 59 | Admitting: Internal Medicine

## 2011-10-12 ENCOUNTER — Encounter: Payer: Self-pay | Admitting: Internal Medicine

## 2011-10-12 VITALS — BP 124/78 | HR 76 | Temp 98.3°F | Resp 16 | Ht 71.0 in | Wt 212.0 lb

## 2011-10-12 DIAGNOSIS — J309 Allergic rhinitis, unspecified: Secondary | ICD-10-CM

## 2011-10-12 DIAGNOSIS — Z86010 Personal history of colonic polyps: Secondary | ICD-10-CM

## 2011-10-12 DIAGNOSIS — J45909 Unspecified asthma, uncomplicated: Secondary | ICD-10-CM

## 2011-10-12 DIAGNOSIS — Z0000 Encounter for general adult medical examination without abnormal findings: Secondary | ICD-10-CM

## 2011-10-12 MED ORDER — ALBUTEROL SULFATE HFA 108 (90 BASE) MCG/ACT IN AERS: 2.0000 | INHALATION_SPRAY | Freq: Four times a day (QID) | RESPIRATORY_TRACT | Status: DC | PRN

## 2011-10-12 MED ORDER — TADALAFIL 20 MG PO TABS: 20.0000 mg | ORAL_TABLET | Freq: Every day | ORAL | Status: DC | PRN

## 2011-10-12 NOTE — Patient Instructions (Signed)
It is important that you exercise regularly, at least 20 minutes 3 to 4 times per week.  If you develop chest pain or shortness of breath seek  medical attention.  Return in one year for follow-up   

## 2011-10-12 NOTE — Progress Notes (Signed)
Subjective:    Patient ID: [name], male    DOB: October 23, 1950, 61 y.o.   MRN: [id number]  HPI  61 year old patient who is seen today for a health maintenance exam. He is approximately 2 weeks from a hemorrhoidal banding which was quite uncomfortable. The pain has largely resolved as has the bleeding. He states it was a very uncomfortable procedure. He is status post colonoscopy about 6 months ago. Laboratory profile reviewed  Allergies (verified):  1) ! * Horse Serum   Past History:  Past Medical History:  Asthma  Colonic polyps, hx of  Osteoarthritis  Allergic rhinitis  h/o ETOH  h/o Tobacco   Past Surgical History:  status post left hip replacement surgery May 2010 at [facility]  colonoscopy 2008  Hemorrhoidal banding April 2013  Family History:  Reviewed history and no changes required.  father died age 9 history coronary artery disease, EtOH, hypertension, type 2 diabetes  mother age 44, in good health  Two brothers two sisters, unremarkable except for obesity   Social History:  Reviewed history and no changes required.  Divorced  two daughters, one son  history of EtOH but abstinent since December 2008  works at [employer]- attempting to discontinue tobaccoSmoking Status: quit < 6 months   Past Medical History  Diagnosis Date  . ALLERGIC RHINITIS 04/19/2009  . ASTHMA 04/19/2009  . COLONIC POLYPS, HX OF 04/19/2009  . OSTEOARTHRITIS 04/19/2009  . Eczema     History   Social History  . Marital Status: Legally Separated    Spouse Name: N/A    Number of Children: N/A  . Years of Education: N/A   Occupational History  . Not on file.   Social History Main Topics  . Smoking status: Former Smoker -- 1.0 packs/day    Quit date: 07/03/2011  . Smokeless tobacco: Never Used  . Alcohol Use: No  . Drug Use: No  . Sexually Active: Not on file   Other Topics Concern  . Not on file   Social History Narrative  . No narrative on file    Past Surgical  History  Procedure Date  . Joint replacement     left hip    No family history on file.  Allergies  Allergen Reactions  . Horse-Derived Products     Current Outpatient Prescriptions on File Prior to Visit  Medication Sig Dispense Refill  . cyclobenzaprine (FLEXERIL) 10 MG tablet Take 1 tablet (10 mg total) by mouth 3 (three) times daily as needed.  30 tablet  4  . HYDROcodone-acetaminophen (VICODIN) 5-500 MG per tablet Take 1 tablet by mouth every 6 (six) hours as needed.  30 tablet  3  . hydrOXYzine (ATARAX/VISTARIL) 25 MG tablet TAKE AS DIRECTED AS NEEDED FOR ITCHING  60 tablet  0  . PROAIR HFA 108 (90 BASE) MCG/ACT inhaler INHALE 2 PUFFS EVERY 6 HOURS AS NEEDED.  17 g  5  . tadalafil (CIALIS) 20 MG tablet Take 1 tablet (20 mg total) by mouth daily as needed.  10 tablet  6  . triamcinolone (KENALOG) 0.1 % cream APPLY TWICE DAILY AS DIRECTED  454 g  2  . triamcinolone (KENALOG) 0.1 % ointment Apply topically 2 (two) times daily.  454 g  4    BP 124/78  Pulse 76  Temp(Src) 98.3 F (36.8 C) (Oral)  Resp 16  Ht 5\' 11"  (1.803 m)  Wt 212 lb (96.163 kg)  BMI 29.57 kg/m2  SpO2 97%  Review of Systems  Constitutional: Negative for fever, chills, activity change, appetite change and fatigue.  HENT: Negative for hearing loss, ear pain, congestion, rhinorrhea, sneezing, mouth sores, trouble swallowing, neck pain, neck stiffness, dental problem, voice change, sinus pressure and tinnitus.   Eyes: Negative for photophobia, pain, redness and visual disturbance.  Respiratory: Negative for apnea, cough, choking, chest tightness, shortness of breath and wheezing.   Cardiovascular: Negative for chest pain, palpitations and leg swelling.  Gastrointestinal: Negative for nausea, vomiting, abdominal pain, diarrhea, constipation, blood in stool, abdominal distention, anal bleeding and rectal pain.  Genitourinary: Negative for dysuria, urgency, frequency, hematuria, flank pain, decreased  urine volume, discharge, penile swelling, scrotal swelling, difficulty urinating, genital sores and testicular pain.  Musculoskeletal: Negative for myalgias, back pain, joint swelling, arthralgias and gait problem.  Skin: Negative for color change, rash and wound.  Neurological: Negative for dizziness, tremors, seizures, syncope, facial asymmetry, speech difficulty, weakness, light-headedness, numbness and headaches.  Hematological: Negative for adenopathy. Does not bruise/bleed easily.  Psychiatric/Behavioral: Negative for suicidal ideas, hallucinations, behavioral problems, confusion, sleep disturbance, self-injury, dysphoric mood, decreased concentration and agitation. The patient is not nervous/anxious.        Objective:   Physical Exam  Constitutional: He appears well-developed and well-nourished.  HENT:  Head: Normocephalic and atraumatic.  Right Ear: External ear normal.  Left Ear: External ear normal.  Nose: Nose normal.  Mouth/Throat: Oropharynx is clear and moist.  Eyes: Conjunctivae and EOM are normal. Pupils are equal, round, and reactive to light. No scleral icterus.  Neck: Normal range of motion. Neck supple. No JVD present. No thyromegaly present.  Cardiovascular: Regular rhythm, normal heart sounds and intact distal pulses.  Exam reveals no gallop and no friction rub.   No murmur heard. Pulmonary/Chest: Effort normal and breath sounds normal. He exhibits no tenderness.  Abdominal: Soft. Bowel sounds are normal. He exhibits no distension and no mass. There is no tenderness.  Genitourinary: Penis normal.  Musculoskeletal: Normal range of motion. He exhibits no edema and no tenderness.  Lymphadenopathy:    He has no cervical adenopathy.  Neurological: He is alert. He has normal reflexes. No cranial nerve deficit. Coordination normal.  Skin: Skin is warm and dry. No rash noted.  Psychiatric: He has a normal mood and affect. His behavior is normal.          Assessment  & Plan:   Preventive health exam History of suppressed TSH. Now normal Asthma stable History of colonic polyps. Status post colonoscopy 2012

## 2011-10-19 ENCOUNTER — Other Ambulatory Visit: Payer: Self-pay | Admitting: Internal Medicine

## 2011-12-15 ENCOUNTER — Ambulatory Visit (INDEPENDENT_AMBULATORY_CARE_PROVIDER_SITE_OTHER): Payer: 59 | Admitting: Internal Medicine

## 2011-12-15 ENCOUNTER — Encounter: Payer: Self-pay | Admitting: Internal Medicine

## 2011-12-15 VITALS — BP 110/76 | Temp 98.3°F | Wt 211.0 lb

## 2011-12-15 DIAGNOSIS — K219 Gastro-esophageal reflux disease without esophagitis: Secondary | ICD-10-CM

## 2011-12-15 DIAGNOSIS — J45909 Unspecified asthma, uncomplicated: Secondary | ICD-10-CM

## 2011-12-15 MED ORDER — DEXLANSOPRAZOLE 60 MG PO CPDR: 60.0000 mg | DELAYED_RELEASE_CAPSULE | Freq: Every day | ORAL | Status: DC

## 2011-12-15 MED ORDER — BUDESONIDE 180 MCG/ACT IN AEPB: 1.0000 | INHALATION_SPRAY | Freq: Two times a day (BID) | RESPIRATORY_TRACT | Status: DC

## 2011-12-15 NOTE — Progress Notes (Signed)
  Subjective:    Patient ID: [name], male    DOB: [birth year], 61 y.o.   MRN: [id number]  HPI  61 year old patient who presents with a one-week history of sense of choking. He states that he has had some swallowing difficulties intermittently with both liquids and solids. Symptoms seem worse at night. Associated symptoms include nausea and occasional belching of gastric contents. No significant dyspepsia. No fever or constitutional complaints. No weight loss He has a history of asthma and states that he is using albuterol on the average of 6 times daily do to tightness in the chest. Denies any real active wheezing.    Review of Systems  Constitutional: Negative for fever, chills, appetite change and fatigue.  HENT: Negative for hearing loss, ear pain, congestion, sore throat, trouble swallowing, neck stiffness, dental problem, voice change and tinnitus.   Eyes: Negative for pain, discharge and visual disturbance.  Respiratory: Positive for choking, chest tightness and shortness of breath. Negative for cough, wheezing and stridor.   Cardiovascular: Negative for chest pain, palpitations and leg swelling.  Gastrointestinal: Negative for nausea, vomiting, abdominal pain, diarrhea, constipation, blood in stool and abdominal distention.  Genitourinary: Negative for urgency, hematuria, flank pain, discharge, difficulty urinating and genital sores.  Musculoskeletal: Negative for myalgias, back pain, joint swelling, arthralgias and gait problem.  Skin: Negative for rash.  Neurological: Negative for dizziness, syncope, speech difficulty, weakness, numbness and headaches.  Hematological: Negative for adenopathy. Does not bruise/bleed easily.  Psychiatric/Behavioral: Negative for behavioral problems and dysphoric mood. The patient is not nervous/anxious.        Objective:   Physical Exam  Constitutional: He is oriented to person, place, and time. He appears well-developed.  HENT:  Head:  Normocephalic.  Right Ear: External ear normal.  Left Ear: External ear normal.  Eyes: Conjunctivae and EOM are normal.  Neck: Normal range of motion.  Cardiovascular: Normal rate and normal heart sounds.   Pulmonary/Chest: Breath sounds normal.  Abdominal: Bowel sounds are normal.  Musculoskeletal: Normal range of motion. He exhibits no edema and no tenderness.  Neurological: He is alert and oriented to person, place, and time.  Psychiatric: He has a normal mood and affect. His behavior is normal.          Assessment & Plan:   GERD. Believe  this explains his symptom complex. We'll place on PPI therapy (DEXILANT) and started aggressive anti-reflux regimen. If unimproved will need EDG with Dr. Kinnie Scales Asthma. We'll give a trial of PULMICORT  for 2 months

## 2011-12-15 NOTE — Patient Instructions (Signed)
Avoids foods high in acid such as tomatoes citrus juices, and spicy foods.  Avoid eating within two hours of lying down or before exercising.  Do not overheat.  Try smaller more frequent meals.  If symptoms persist, elevate the head of her bed 12 inches while sleeping.  Call or return to clinic prn if these symptoms worsen or fail to improve as anticipated. Diet for Gastroesophageal Reflux Disease, Child Some children have small, brief episodes of reflux. Reflux (acid reflux) is when acid from your stomach flows up into the esophagus. When acid comes in contact with the esophagus, the acid causes irritation and soreness (inflammation) in the esophagus. The reflux may be so small that a child may not notice it. When reflux happens often or so severely that it causes damage to the esophagus, it is called gastroesophageal reflux disease (GERD). Nutrition therapy can help ease the discomfort of GERD.   FOODS TO AVOID    Caffeinated and decaffeinated coffee and black tea.   Regular or low-calorie carbonated beverages or energy drinks (caffeine-free carbonated beverages are allowed).   Strong spices, such as black pepper, white pepper, red pepper, cayenne, curry powder, and chili powder.   Peppermint or spearmint.   Chocolate.   High-fat foods, including meats and fried foods. Extra added fats including oils, butter, salad dressings, and nuts. Low-fat foods may not be recommended for children less than 57 years of age. Discuss this with your doctor or dietitian.   Fruits and vegetables that are not tolerated, such as citrus fruits and tomatoes.   Any food that seems to aggravate the child's condition.  If you have questions regarding your child's diet, call your caregiver or a registered dietician. OTHER THINGS THAT MAY HELP GERD INCLUDE:  Having the child eat his or her meals slowly, in a relaxed setting.   Serving several small meals throughout the day instead of 3 large meals.   Eliminating  food for a period of time if it causes distress.   Not letting the child lie down immediately after eating a meal.   Keeping the head of the child's bed raised 6 to 9 inches (15 to 23 cm) by using a foam wedge or blocks under the legs of the bed.   Encouraging the child to be physically active. Weight loss may be helpful in reducing reflux in overweight or obese children.   Having the child wear loose-fitting clothing.   Avoiding the use of tobacco in parents and caregivers. Secondhand smoke may aggravate symptoms in children with reflux.  SAMPLE MEAL PLAN This is a sample meal plan for a 35 to 67 year old child and is approximately 1200 calories based on https://www.bern[facility].org/ meal planning guidelines.   Breakfast   cup cooked oatmeal.    cup strawberries.    cup low-fat milk.  Snack   cup cucumber slices.   4 oz yogurt (made from low-fat milk).  Lunch  1 slice whole-wheat bread.   1 oz chicken.    cup blueberries.    cup snap peas.  Snack  3 whole-wheat crackers.   1 oz string cheese.  Dinner   cup brown rice.    cup mixed veggies.   1 cup low-fat milk.   2 oz grilled fish.  Document Released: 11/01/2006 Document Revised: 06/04/2011 Document Reviewed: 05/07/2011 [facility] Patient Information 2012 [place], [place].Gastroesophageal Reflux Disease, Adult Gastroesophageal reflux disease (GERD) happens when acid from your stomach flows up into the esophagus. When acid comes in contact with  the esophagus, the acid causes soreness (inflammation) in the esophagus. Over time, GERD may create small holes (ulcers) in the lining of the esophagus. CAUSES    Increased body weight. This puts pressure on the stomach, making acid rise from the stomach into the esophagus.   Smoking. This increases acid production in the stomach.   Drinking alcohol. This causes decreased pressure in the lower esophageal sphincter (valve or ring of muscle between the esophagus and stomach),  allowing acid from the stomach into the esophagus.   Late evening meals and a full stomach. This increases pressure and acid production in the stomach.   A malformed lower esophageal sphincter.  Sometimes, no cause is found. SYMPTOMS    Burning pain in the lower part of the mid-chest behind the breastbone and in the mid-stomach area. This may occur twice a week or more often.   Trouble swallowing.   Sore throat.   Dry cough.   Asthma-like symptoms including chest tightness, shortness of breath, or wheezing.  DIAGNOSIS   Your caregiver may be able to diagnose GERD based on your symptoms. In some cases, X-rays and other tests may be done to check for complications or to check the condition of your stomach and esophagus. TREATMENT   Your caregiver may recommend over-the-counter or prescription medicines to help decrease acid production. Ask your caregiver before starting or adding any new medicines.   HOME CARE INSTRUCTIONS    Change the factors that you can control. Ask your caregiver for guidance concerning weight loss, quitting smoking, and alcohol consumption.   Avoid foods and drinks that make your symptoms worse, such as:   Caffeine or alcoholic drinks.   Chocolate.   Peppermint or mint flavorings.   Garlic and onions.   Spicy foods.   Citrus fruits, such as oranges, lemons, or limes.   Tomato-based foods such as sauce, chili, salsa, and pizza.   Fried and fatty foods.   Avoid lying down for the 3 hours prior to your bedtime or prior to taking a nap.   Eat small, frequent meals instead of large meals.   Wear loose-fitting clothing. Do not wear anything tight around your waist that causes pressure on your stomach.   Raise the head of your bed 6 to 8 inches with wood blocks to help you sleep. Extra pillows will not help.   Only take over-the-counter or prescription medicines for pain, discomfort, or fever as directed by your caregiver.   Do not take aspirin,  ibuprofen, or other nonsteroidal anti-inflammatory drugs (NSAIDs).  SEEK IMMEDIATE MEDICAL CARE IF:    You have pain in your arms, neck, jaw, teeth, or back.   Your pain increases or changes in intensity or duration.   You develop nausea, vomiting, or sweating (diaphoresis).   You develop shortness of breath, or you faint.   Your vomit is green, yellow, black, or looks like coffee grounds or blood.   Your stool is red, bloody, or black.  These symptoms could be signs of other problems, such as heart disease, gastric bleeding, or esophageal bleeding. MAKE SURE YOU:    Understand these instructions.   Will watch your condition.   Will get help right away if you are not doing well or get worse.  Document Released: 03/25/2005 Document Revised: 06/04/2011 Document Reviewed: 01/02/2011 [facility] Patient Information 2012 [place], [place].

## 2012-01-27 ENCOUNTER — Telehealth: Payer: Self-pay | Admitting: Internal Medicine

## 2012-01-27 MED ORDER — CYCLOBENZAPRINE HCL 10 MG PO TABS: 10.0000 mg | ORAL_TABLET | Freq: Three times a day (TID) | ORAL | Status: AC | PRN

## 2012-01-27 NOTE — Telephone Encounter (Signed)
Spoke with pt- informed of rx and dr. Vernon Prey instructions

## 2012-01-27 NOTE — Telephone Encounter (Signed)
Last seen 12/15/11 Please advise - no muscle relaxer on med list

## 2012-01-27 NOTE — Telephone Encounter (Signed)
PT thinks he has a pulled muscle in arm says Dr. Kirtland Bouchard has seen him before for this and wanted to know if he could get some meds with out being seen? Please call him.

## 2012-01-27 NOTE — Telephone Encounter (Signed)
Take Aleve 200 mg twice daily for pain or swelling  Okay to call in a prescription for generic Flexeril 10 mg #30 to take 1 3 times a day when necessary he

## 2012-04-04 ENCOUNTER — Ambulatory Visit (INDEPENDENT_AMBULATORY_CARE_PROVIDER_SITE_OTHER): Payer: 59 | Admitting: Internal Medicine

## 2012-04-04 ENCOUNTER — Encounter: Payer: Self-pay | Admitting: Internal Medicine

## 2012-04-04 VITALS — BP 132/80 | Temp 98.0°F | Wt 213.0 lb

## 2012-04-04 DIAGNOSIS — R079 Chest pain, unspecified: Secondary | ICD-10-CM

## 2012-04-04 DIAGNOSIS — M549 Dorsalgia, unspecified: Secondary | ICD-10-CM

## 2012-04-04 MED ORDER — HYDROCODONE-ACETAMINOPHEN 5-500 MG PO TABS: 1.0000 | ORAL_TABLET | Freq: Four times a day (QID) | ORAL | Status: DC | PRN

## 2012-04-04 NOTE — Progress Notes (Signed)
Subjective:    Patient ID: [name], male    DOB: 02/22/1951, 61 y.o.   MRN: [id number]  HPI  61 year old patient who has a history of asthma and osteoarthritis. For the past week he has had some right posterior mid back pain described as a sharp sensation with some radiation to the right anterior chest area. This is more of a burning quality pain is aggravated by deep inspiration. Denies any fever cough or wheezing. His asthma has been stable.  Past Medical History  Diagnosis Date  . ALLERGIC RHINITIS 04/19/2009  . ASTHMA 04/19/2009  . COLONIC POLYPS, HX OF 04/19/2009  . OSTEOARTHRITIS 04/19/2009  . Eczema     History   Social History  . Marital Status: Legally Separated    Spouse Name: N/A    Number of Children: N/A  . Years of Education: N/A   Occupational History  . Not on file.   Social History Main Topics  . Smoking status: Former Smoker -- 1.0 packs/day    Quit date: 07/03/2011  . Smokeless tobacco: Never Used  . Alcohol Use: No  . Drug Use: No  . Sexually Active: Not on file   Other Topics Concern  . Not on file   Social History Narrative  . No narrative on file    Past Surgical History  Procedure Date  . Joint replacement     left hip    No family history on file.  Allergies  Allergen Reactions  . Horse-Derived Products     Current Outpatient Prescriptions on File Prior to Visit  Medication Sig Dispense Refill  . albuterol (PROAIR HFA) 108 (90 BASE) MCG/ACT inhaler Inhale 2 puffs into the lungs every 6 (six) hours as needed for wheezing.  17 g  5  . budesonide (PULMICORT) 180 MCG/ACT inhaler Inhale 1 puff into the lungs 2 (two) times daily.  1 Inhaler  4  . tadalafil (CIALIS) 20 MG tablet Take 1 tablet (20 mg total) by mouth daily as needed.  10 tablet  6  . triamcinolone (KENALOG) 0.1 % cream APPLY TWICE DAILY AS DIRECTED  454 g  2  . triamcinolone (KENALOG) 0.1 % ointment Apply topically 2 (two) times daily.  454 g  4  .  HYDROcodone-acetaminophen (VICODIN) 5-500 MG per tablet Take 1 tablet by mouth every 6 (six) hours as needed.  30 tablet  3  . hydrOXYzine (ATARAX/VISTARIL) 25 MG tablet TAKE AS DIRECTED AS NEEDED FOR ITCHING  60 tablet  0  . DISCONTD: dexlansoprazole (DEXILANT) 60 MG capsule Take 1 capsule (60 mg total) by mouth daily.  60 capsule  2    BP 132/80  Temp 98 F (36.7 C) (Oral)  Wt 213 lb (96.616 kg)       Review of Systems  Constitutional: Negative for fever, chills, appetite change and fatigue.  HENT: Negative for hearing loss, ear pain, congestion, sore throat, trouble swallowing, neck stiffness, dental problem, voice change and tinnitus.   Eyes: Negative for pain, discharge and visual disturbance.  Respiratory: Negative for cough, chest tightness, wheezing and stridor.   Cardiovascular: Positive for chest pain. Negative for palpitations and leg swelling.  Gastrointestinal: Negative for nausea, vomiting, abdominal pain, diarrhea, constipation, blood in stool and abdominal distention.  Genitourinary: Negative for urgency, hematuria, flank pain, discharge, difficulty urinating and genital sores.  Musculoskeletal: Positive for back pain. Negative for myalgias, joint swelling, arthralgias and gait problem.  Skin: Negative for rash.  Neurological: Negative for dizziness, syncope, speech difficulty, weakness,  numbness and headaches.  Hematological: Negative for adenopathy. Does not bruise/bleed easily.  Psychiatric/Behavioral: Negative for behavioral problems and dysphoric mood. The patient is not nervous/anxious.        Objective:   Physical Exam  Constitutional: He is oriented to person, place, and time. He appears well-developed.  HENT:  Head: Normocephalic.  Right Ear: External ear normal.  Left Ear: External ear normal.  Eyes: Conjunctivae normal and EOM are normal.  Neck: Normal range of motion.  Cardiovascular: Normal rate and normal heart sounds.   Pulmonary/Chest: Effort  normal and breath sounds normal. No respiratory distress. He has no wheezes. He has no rales. He exhibits tenderness.       O2 saturation 97  Tenderness right lateral chest wall area  Abdominal: Bowel sounds are normal.  Musculoskeletal: Normal range of motion. He exhibits no edema and no tenderness.  Neurological: He is alert and oriented to person, place, and time.  Psychiatric: He has a normal mood and affect. His behavior is normal.          Assessment & Plan:   Chest wall pain. Will continue maintenance medications we'll refill on hydrocodone and short-term Asthma stable

## 2012-04-04 NOTE — Patient Instructions (Addendum)
Call or return to clinic prn if these symptoms worsen or fail to improve as anticipated.   Take Aleve 200 mg twice daily for pain or swelling 

## 2012-04-15 ENCOUNTER — Other Ambulatory Visit: Payer: Self-pay | Admitting: Internal Medicine

## 2012-04-22 ENCOUNTER — Other Ambulatory Visit: Payer: Self-pay | Admitting: Internal Medicine

## 2012-08-26 ENCOUNTER — Other Ambulatory Visit: Payer: Self-pay | Admitting: Internal Medicine

## 2012-10-06 ENCOUNTER — Other Ambulatory Visit (INDEPENDENT_AMBULATORY_CARE_PROVIDER_SITE_OTHER): Payer: 59

## 2012-10-06 DIAGNOSIS — Z0000 Encounter for general adult medical examination without abnormal findings: Secondary | ICD-10-CM

## 2012-10-06 LAB — POCT URINALYSIS DIPSTICK
Blood, UA: NEGATIVE
Glucose, UA: NEGATIVE
Nitrite, UA: NEGATIVE
Protein, UA: NEGATIVE
Spec Grav, UA: 1.025
Urobilinogen, UA: 0.2
pH, UA: 5.5

## 2012-10-06 LAB — BASIC METABOLIC PANEL
BUN: 12 mg/dL (ref 6–23)
Calcium: 9.3 mg/dL (ref 8.4–10.5)
Creatinine, Ser: 1.1 mg/dL (ref 0.4–1.5)
GFR: 73.51 mL/min (ref >60.00)
Glucose, Bld: 85 mg/dL (ref 70–99)

## 2012-10-06 LAB — CBC WITH DIFFERENTIAL/PLATELET
Basophils Relative: 1.2 % (ref 0.0–3.0)
Eosinophils Absolute: 0.2 10*3/uL (ref 0.0–0.7)
Eosinophils Relative: 3.6 % (ref 0.0–5.0)
HCT: 46.9 % (ref 39.0–52.0)
Hemoglobin: 15.6 g/dL (ref 13.0–17.0)
Lymphs Abs: 2.2 10*3/uL (ref 0.7–4.0)
MCHC: 33.4 g/dL (ref 30.0–36.0)
MCV: 93.5 fl (ref 78.0–100.0)
Monocytes Absolute: 0.6 10*3/uL (ref 0.1–1.0)
Neutro Abs: 1.9 10*3/uL (ref 1.4–7.7)
Neutrophils Relative %: 38.3 % — ABNORMAL LOW (ref 43.0–77.0)
RBC: 5.01 Mil/uL (ref 4.22–5.81)

## 2012-10-06 LAB — TSH: TSH: 0.4 u[IU]/mL (ref 0.35–5.50)

## 2012-10-06 LAB — HEPATIC FUNCTION PANEL
ALT: 39 U/L (ref 0–53)
AST: 37 U/L (ref 0–37)
Albumin: 3.7 g/dL (ref 3.5–5.2)
Alkaline Phosphatase: 67 U/L (ref 39–117)

## 2012-10-06 LAB — LIPID PANEL: Cholesterol: 206 mg/dL — ABNORMAL HIGH (ref 0–200)

## 2012-10-13 ENCOUNTER — Ambulatory Visit (INDEPENDENT_AMBULATORY_CARE_PROVIDER_SITE_OTHER): Payer: 59 | Admitting: Internal Medicine

## 2012-10-13 ENCOUNTER — Encounter: Payer: Self-pay | Admitting: Internal Medicine

## 2012-10-13 VITALS — BP 140/80 | HR 73 | Temp 98.5°F | Resp 18 | Ht 69.75 in | Wt 204.0 lb

## 2012-10-13 DIAGNOSIS — Z0000 Encounter for general adult medical examination without abnormal findings: Secondary | ICD-10-CM

## 2012-10-13 DIAGNOSIS — J309 Allergic rhinitis, unspecified: Secondary | ICD-10-CM

## 2012-10-13 DIAGNOSIS — R972 Elevated prostate specific antigen [PSA]: Secondary | ICD-10-CM

## 2012-10-13 DIAGNOSIS — J45909 Unspecified asthma, uncomplicated: Secondary | ICD-10-CM

## 2012-10-13 MED ORDER — MONTELUKAST SODIUM 10 MG PO TABS: 10.0000 mg | ORAL_TABLET | Freq: Every day | ORAL | Status: DC

## 2012-10-13 MED ORDER — ALBUTEROL SULFATE HFA 108 (90 BASE) MCG/ACT IN AERS: 2.0000 | INHALATION_SPRAY | Freq: Four times a day (QID) | RESPIRATORY_TRACT | Status: DC | PRN

## 2012-10-13 MED ORDER — FLUTICASONE PROPIONATE 50 MCG/ACT NA SUSP: 2.0000 | Freq: Every day | NASAL | Status: DC

## 2012-10-13 MED ORDER — BUDESONIDE 180 MCG/ACT IN AEPB: 1.0000 | INHALATION_SPRAY | Freq: Two times a day (BID) | RESPIRATORY_TRACT | Status: DC

## 2012-10-13 NOTE — Patient Instructions (Signed)
Return in 6 months for follow-up  and a repeat PSA determination

## 2012-10-13 NOTE — Progress Notes (Signed)
Subjective:    Patient ID: [name], male    DOB: December 19, 1950, 62 y.o.   MRN: [id number]  HPI 62 year old patient who is seen today for a wellness exam. He has a history of asthma and allergic rhinitis. His asthma has been quite stable but he has considerable sinus symptoms. He is quite sensitive to perfumes dust as well his typical allergens. He does exercise regularly and uses the albuterol prophylactically for exercise-induced symptoms  Past Medical History  Diagnosis Date  . ALLERGIC RHINITIS 04/19/2009  . ASTHMA 04/19/2009  . COLONIC POLYPS, HX OF 04/19/2009  . OSTEOARTHRITIS 04/19/2009  . Eczema     History   Social History  . Marital Status: Legally Separated    Spouse Name: N/A    Number of Children: N/A  . Years of Education: N/A   Occupational History  . Not on file.   Social History Main Topics  . Smoking status: Former Smoker -- 1.00 packs/day    Quit date: 07/03/2011  . Smokeless tobacco: Never Used  . Alcohol Use: No  . Drug Use: No  . Sexually Active: Not on file   Other Topics Concern  . Not on file   Social History Narrative  . No narrative on file    Past Surgical History  Procedure Laterality Date  . Joint replacement      left hip    No family history on file.  Allergies  Allergen Reactions  . Horse-Derived Products     Current Outpatient Prescriptions on File Prior to Visit  Medication Sig Dispense Refill  . albuterol (PROAIR HFA) 108 (90 BASE) MCG/ACT inhaler Inhale 2 puffs into the lungs every 6 (six) hours as needed for wheezing.  17 g  5  . budesonide (PULMICORT) 180 MCG/ACT inhaler Inhale 1 puff into the lungs 2 (two) times daily.  1 Inhaler  4  . cyclobenzaprine (FLEXERIL) 10 MG tablet Take 10 mg by mouth 3 (three) times daily as needed.      . hydrOXYzine (ATARAX/VISTARIL) 25 MG tablet TAKE AS DIRECTED AS NEEDED FOR ITCHING  60 tablet  0  . tadalafil (CIALIS) 20 MG tablet Take 1 tablet (20 mg total) by mouth daily as needed.   10 tablet  6  . triamcinolone cream (KENALOG) 0.1 % APPLY TWICE DAILY AS DIRECTED  454 g  3  . triamcinolone ointment (KENALOG) 0.1 % APPLY TWICE A DAY  454 g  0   No current facility-administered medications on file prior to visit.    BP 140/80  Pulse 73  Temp(Src) 98.5 F (36.9 C) (Oral)  Resp 18  Ht 5' 9.75" (1.772 m)  Wt 204 lb (92.534 kg)  BMI 29.47 kg/m2  SpO2 96%         Review of Systems  Constitutional: Negative for fever, chills, activity change, appetite change and fatigue.  HENT: Positive for congestion. Negative for hearing loss, ear pain, rhinorrhea, sneezing, mouth sores, trouble swallowing, neck pain, neck stiffness, dental problem, voice change, sinus pressure and tinnitus.   Eyes: Positive for redness and itching. Negative for photophobia, pain and visual disturbance.  Respiratory: Negative for apnea, cough, choking, chest tightness, shortness of breath and wheezing.   Cardiovascular: Negative for chest pain, palpitations and leg swelling.  Gastrointestinal: Negative for nausea, vomiting, abdominal pain, diarrhea, constipation, blood in stool, abdominal distention, anal bleeding and rectal pain.  Genitourinary: Negative for dysuria, urgency, frequency, hematuria, flank pain, decreased urine volume, discharge, penile swelling, scrotal swelling, difficulty urinating, genital  sores and testicular pain.  Musculoskeletal: Negative for myalgias, back pain, joint swelling, arthralgias and gait problem.  Skin: Negative for color change, rash and wound.  Neurological: Negative for dizziness, tremors, seizures, syncope, facial asymmetry, speech difficulty, weakness, light-headedness, numbness and headaches.  Hematological: Negative for adenopathy. Does not bruise/bleed easily.  Psychiatric/Behavioral: Negative for suicidal ideas, hallucinations, behavioral problems, confusion, sleep disturbance, self-injury, dysphoric mood, decreased concentration and agitation. The patient  is not nervous/anxious.        Objective:   Physical Exam  Constitutional: He appears well-developed and well-nourished.  HENT:  Head: Normocephalic and atraumatic.  Right Ear: External ear normal.  Left Ear: External ear normal.  Nose: Nose normal.  Mouth/Throat: Oropharynx is clear and moist.  Eyes: Conjunctivae and EOM are normal. Pupils are equal, round, and reactive to light. No scleral icterus.  Neck: Normal range of motion. Neck supple. No JVD present. No thyromegaly present.  Cardiovascular: Regular rhythm, normal heart sounds and intact distal pulses.  Exam reveals no gallop and no friction rub.   No murmur heard. Pulmonary/Chest: Effort normal and breath sounds normal. He has no wheezes. He exhibits no tenderness.  Abdominal: Soft. Bowel sounds are normal. He exhibits no distension and no mass. There is no tenderness.  Genitourinary: Prostate normal and penis normal.  Musculoskeletal: Normal range of motion. He exhibits no edema and no tenderness.  Lymphadenopathy:    He has no cervical adenopathy.  Neurological: He is alert. He has normal reflexes. No cranial nerve deficit. Coordination normal.  Skin: Skin is warm and dry. No rash noted.  Psychiatric: He has a normal mood and affect. His behavior is normal.          Assessment & Plan:  Preventive health examination Asthma Allergic rhinitis  We'll give a two-month trial of Singulair. We'll also place on fluticasone. Manus as her medications will be continued Recheck in 6 months with repeat PSA at that time

## 2012-11-11 ENCOUNTER — Other Ambulatory Visit: Payer: Self-pay | Admitting: Internal Medicine

## 2013-02-10 ENCOUNTER — Telehealth: Payer: Self-pay | Admitting: Internal Medicine

## 2013-02-10 MED ORDER — HYDROXYZINE HCL 25 MG PO TABS: ORAL_TABLET | ORAL | Status: DC

## 2013-02-10 NOTE — Telephone Encounter (Signed)
Pt notified Rx sent to pharmacy as requested. 

## 2013-02-10 NOTE — Telephone Encounter (Signed)
Pt is requesting a refill of his hydrOXYzine (ATARAX/VISTARIL) 25 MG tablet be sent to [facility] on [address]. He states that he called the pharmacy on Tuesday 8/12 to request this RX, but they must not have sent it over. Please assist.

## 2013-04-13 ENCOUNTER — Other Ambulatory Visit: Payer: Self-pay | Admitting: Internal Medicine

## 2013-04-13 ENCOUNTER — Encounter: Payer: Self-pay | Admitting: Internal Medicine

## 2013-04-13 ENCOUNTER — Ambulatory Visit (INDEPENDENT_AMBULATORY_CARE_PROVIDER_SITE_OTHER): Payer: 59 | Admitting: Internal Medicine

## 2013-04-13 VITALS — BP 140/86 | HR 73 | Temp 98.0°F | Resp 20 | Wt 206.0 lb

## 2013-04-13 DIAGNOSIS — J309 Allergic rhinitis, unspecified: Secondary | ICD-10-CM

## 2013-04-13 DIAGNOSIS — R972 Elevated prostate specific antigen [PSA]: Secondary | ICD-10-CM

## 2013-04-13 DIAGNOSIS — Z23 Encounter for immunization: Secondary | ICD-10-CM

## 2013-04-13 DIAGNOSIS — J45909 Unspecified asthma, uncomplicated: Secondary | ICD-10-CM

## 2013-04-13 MED ORDER — TRIAMCINOLONE ACETONIDE 0.1 % EX CREA: TOPICAL_CREAM | CUTANEOUS | Status: DC

## 2013-04-13 MED ORDER — TADALAFIL 20 MG PO TABS: ORAL_TABLET | ORAL | Status: DC

## 2013-04-13 NOTE — Progress Notes (Signed)
Subjective:    Patient ID: [name], male    DOB: March 26, 1951, 62 y.o.   MRN: [id number]  HPI  62 year old patient who has a history of asthma allergic rhinitis. This has been stable. 6 months ago he was seen for his annual health assessment and PSA was mildly elevated. They're quite well without concerns or complaints. Continues to exercise regularly.  Past Medical History  Diagnosis Date  . ALLERGIC RHINITIS 04/19/2009  . ASTHMA 04/19/2009  . COLONIC POLYPS, HX OF 04/19/2009  . OSTEOARTHRITIS 04/19/2009  . Eczema     History   Social History  . Marital Status: Legally Separated    Spouse Name: N/A    Number of Children: N/A  . Years of Education: N/A   Occupational History  . Not on file.   Social History Main Topics  . Smoking status: Former Smoker -- 1.00 packs/day    Quit date: 07/03/2011  . Smokeless tobacco: Never Used  . Alcohol Use: No  . Drug Use: No  . Sexual Activity: Not on file   Other Topics Concern  . Not on file   Social History Narrative  . No narrative on file    Past Surgical History  Procedure Laterality Date  . Joint replacement      left hip    No family history on file.  Allergies  Allergen Reactions  . Horse-Derived Products     Current Outpatient Prescriptions on File Prior to Visit  Medication Sig Dispense Refill  . albuterol (PROAIR HFA) 108 (90 BASE) MCG/ACT inhaler Inhale 2 puffs into the lungs every 6 (six) hours as needed for wheezing.  17 g  5  . budesonide (PULMICORT) 180 MCG/ACT inhaler Inhale 1 puff into the lungs 2 (two) times daily.  1 Inhaler  4  . cyclobenzaprine (FLEXERIL) 10 MG tablet Take 10 mg by mouth 3 (three) times daily as needed.      . fluticasone (FLONASE) 50 MCG/ACT nasal spray Place 2 sprays into the nose daily.  16 g  6  . hydrOXYzine (ATARAX/VISTARIL) 25 MG tablet TAKE AS DIRECTED AS NEEDED FOR ITCHING  60 tablet  1  . montelukast (SINGULAIR) 10 MG tablet Take 1 tablet (10 mg total) by mouth at  bedtime.  30 tablet  3   No current facility-administered medications on file prior to visit.    BP 140/86  Pulse 73  Temp(Src) 98 F (36.7 C) (Oral)  Resp 20  Wt 206 lb (93.441 kg)  BMI 29.76 kg/m2  SpO2 98%       Review of Systems  Constitutional: Negative for fever, chills, appetite change and fatigue.  HENT: Negative for congestion, dental problem, ear pain, hearing loss, sore throat, tinnitus, trouble swallowing and voice change.   Eyes: Negative for pain, discharge and visual disturbance.  Respiratory: Negative for cough, chest tightness, wheezing and stridor.   Cardiovascular: Negative for chest pain, palpitations and leg swelling.  Gastrointestinal: Negative for nausea, vomiting, abdominal pain, diarrhea, constipation, blood in stool and abdominal distention.  Genitourinary: Negative for urgency, hematuria, flank pain, discharge, difficulty urinating and genital sores.  Musculoskeletal: Negative for arthralgias, back pain, gait problem, joint swelling, myalgias and neck stiffness.  Skin: Negative for rash.  Neurological: Negative for dizziness, syncope, speech difficulty, weakness, numbness and headaches.  Hematological: Negative for adenopathy. Does not bruise/bleed easily.  Psychiatric/Behavioral: Negative for behavioral problems and dysphoric mood. The patient is not nervous/anxious.        Objective:   Physical Exam  Constitutional: He is oriented to person, place, and time. He appears well-developed.  HENT:  Head: Normocephalic.  Right Ear: External ear normal.  Left Ear: External ear normal.  Eyes: Conjunctivae and EOM are normal.  Neck: Normal range of motion.  Cardiovascular: Normal rate and normal heart sounds.   Pulmonary/Chest: Breath sounds normal.  Abdominal: Bowel sounds are normal.  Musculoskeletal: Normal range of motion. He exhibits no edema and no tenderness.  Neurological: He is alert and oriented to person, place, and time.  Psychiatric: He  has a normal mood and affect. His behavior is normal.          Assessment & Plan:   Asthma/allergic rhinitis. Stable we'll continue a maintenance medication History of elevated PSA. We'll repeat; if still elevated we'll set up for urological evaluation  CPX 6 months

## 2013-04-13 NOTE — Patient Instructions (Signed)
It is important that you exercise regularly, at least 20 minutes 3 to 4 times per week.  If you develop chest pain or shortness of breath seek  medical attention.  Return in 6 months for follow-up  

## 2013-04-17 ENCOUNTER — Encounter: Payer: Self-pay | Admitting: Internal Medicine

## 2013-05-09 ENCOUNTER — Telehealth: Payer: Self-pay | Admitting: Internal Medicine

## 2013-05-09 NOTE — Telephone Encounter (Signed)
Pt had appt to see urologist today. Pt had to reschedule due to no copay. Pt is reschedule to 12/8. Pt would like to drop off urine sample to make sure he does not have any infection. Pt was referred to urologist for elevated PSA. Please advise

## 2013-05-10 NOTE — Telephone Encounter (Signed)
Spoke to pt asked him if having any symptoms like burning or pain with urination? Pt stated no just thought he could do one because Urology wanted a urine with visit and his visit was rescheduled. Told pt as long as you are not having symptoms okay to wait till appt with urology they will do a urine at visit. Pt verbalized understanding.

## 2013-07-03 ENCOUNTER — Ambulatory Visit: Payer: 59

## 2013-07-03 ENCOUNTER — Institutional Professional Consult (permissible substitution): Payer: 59 | Admitting: Radiation Oncology

## 2013-07-06 ENCOUNTER — Other Ambulatory Visit: Payer: Self-pay | Admitting: Urology

## 2013-08-08 ENCOUNTER — Inpatient Hospital Stay (HOSPITAL_COMMUNITY): Admission: RE | Admit: 2013-08-08 | Payer: 59 | Source: Ambulatory Visit

## 2013-08-11 ENCOUNTER — Encounter (HOSPITAL_COMMUNITY): Admission: RE | Payer: Self-pay | Source: Ambulatory Visit

## 2013-08-11 ENCOUNTER — Inpatient Hospital Stay (HOSPITAL_COMMUNITY): Admission: RE | Admit: 2013-08-11 | Payer: 59 | Source: Ambulatory Visit | Admitting: Urology

## 2013-08-11 SURGERY — ROBOTIC ASSISTED LAPAROSCOPIC RADICAL PROSTATECTOMY
Anesthesia: General | Laterality: Bilateral

## 2013-08-20 ENCOUNTER — Other Ambulatory Visit: Payer: Self-pay | Admitting: Internal Medicine

## 2013-09-16 ENCOUNTER — Emergency Department (HOSPITAL_COMMUNITY): Payer: 59

## 2013-09-16 ENCOUNTER — Encounter (HOSPITAL_COMMUNITY): Payer: Self-pay | Admitting: Emergency Medicine

## 2013-09-16 ENCOUNTER — Emergency Department (HOSPITAL_COMMUNITY)
Admission: EM | Admit: 2013-09-16 | Discharge: 2013-09-16 | Disposition: A | Discharge status: Home / Self Care | Payer: 59 | Attending: Emergency Medicine | Admitting: Emergency Medicine

## 2013-09-16 DIAGNOSIS — Z860100 Personal history of colon polyps, unspecified: Secondary | ICD-10-CM | POA: Insufficient documentation

## 2013-09-16 DIAGNOSIS — Y836 Removal of other organ (partial) (total) as the cause of abnormal reaction of the patient, or of later complication, without mention of misadventure at the time of the procedure: Secondary | ICD-10-CM | POA: Insufficient documentation

## 2013-09-16 DIAGNOSIS — Z87891 Personal history of nicotine dependence: Secondary | ICD-10-CM | POA: Insufficient documentation

## 2013-09-16 DIAGNOSIS — Z79899 Other long term (current) drug therapy: Secondary | ICD-10-CM | POA: Insufficient documentation

## 2013-09-16 DIAGNOSIS — L259 Unspecified contact dermatitis, unspecified cause: Secondary | ICD-10-CM | POA: Insufficient documentation

## 2013-09-16 DIAGNOSIS — IMO0002 Reserved for concepts with insufficient information to code with codable children: Secondary | ICD-10-CM | POA: Insufficient documentation

## 2013-09-16 DIAGNOSIS — R369 Urethral discharge, unspecified: Secondary | ICD-10-CM | POA: Insufficient documentation

## 2013-09-16 DIAGNOSIS — M1990 Unspecified osteoarthritis, unspecified site: Secondary | ICD-10-CM | POA: Insufficient documentation

## 2013-09-16 DIAGNOSIS — N4889 Other specified disorders of penis: Secondary | ICD-10-CM | POA: Insufficient documentation

## 2013-09-16 DIAGNOSIS — J45909 Unspecified asthma, uncomplicated: Secondary | ICD-10-CM | POA: Insufficient documentation

## 2013-09-16 LAB — BASIC METABOLIC PANEL
BUN: 21 mg/dL (ref 6–23)
CHLORIDE: 94 meq/L — AB (ref 96–112)
CO2: 28 mEq/L (ref 19–32)
Calcium: 8.9 mg/dL (ref 8.4–10.5)
Creatinine, Ser: 1.38 mg/dL — ABNORMAL HIGH (ref 0.50–1.35)
GFR calc non Af Amer: 53 mL/min — ABNORMAL LOW (ref >90)
GFR, EST AFRICAN AMERICAN: 61 mL/min — AB (ref >90)
Glucose, Bld: 101 mg/dL — ABNORMAL HIGH (ref 70–99)
POTASSIUM: 3.6 meq/L — AB (ref 3.7–5.3)
SODIUM: 134 meq/L — AB (ref 137–147)

## 2013-09-16 LAB — CBC WITH DIFFERENTIAL/PLATELET
BASOS PCT: 0 % (ref 0–1)
Basophils Absolute: 0 10*3/uL (ref 0.0–0.1)
Eosinophils Absolute: 0.3 10*3/uL (ref 0.0–0.7)
Eosinophils Relative: 3 % (ref 0–5)
HCT: 37.6 % — ABNORMAL LOW (ref 39.0–52.0)
Hemoglobin: 12.5 g/dL — ABNORMAL LOW (ref 13.0–17.0)
Lymphocytes Relative: 19 % (ref 12–46)
Lymphs Abs: 1.6 10*3/uL (ref 0.7–4.0)
MCH: 32.1 pg (ref 26.0–34.0)
MCHC: 33.2 g/dL (ref 30.0–36.0)
MCV: 96.4 fL (ref 78.0–100.0)
MONOS PCT: 13 % — AB (ref 3–12)
Monocytes Absolute: 1.1 10*3/uL — ABNORMAL HIGH (ref 0.1–1.0)
NEUTROS ABS: 5.5 10*3/uL (ref 1.7–7.7)
NEUTROS PCT: 65 % (ref 43–77)
PLATELETS: 232 10*3/uL (ref 150–400)
RBC: 3.9 MIL/uL — AB (ref 4.22–5.81)
RDW: 14.1 % (ref 11.5–15.5)
WBC: 8.5 10*3/uL (ref 4.0–10.5)

## 2013-09-16 LAB — URINALYSIS, ROUTINE W REFLEX MICROSCOPIC
Bilirubin Urine: NEGATIVE
Glucose, UA: NEGATIVE mg/dL
Ketones, ur: NEGATIVE mg/dL
NITRITE: NEGATIVE
PH: 5.5 (ref 5.0–8.0)
Protein, ur: 100 mg/dL — AB
Specific Gravity, Urine: 1.02 (ref 1.005–1.030)
Urobilinogen, UA: 1 mg/dL (ref 0.0–1.0)

## 2013-09-16 LAB — URINE MICROSCOPIC-ADD ON

## 2013-09-16 MED ORDER — IOHEXOL 300 MG/ML  SOLN
100.0000 mL | Freq: Once | INTRAMUSCULAR | Status: AC | PRN
  Administered 2013-09-16: 100 mL via INTRAVENOUS

## 2013-09-16 NOTE — ED Notes (Signed)
Bed: ZM08 Expected date:  Expected time:  Means of arrival:  Comments: EMS groin swelling, recent prostate surgery

## 2013-09-16 NOTE — ED Provider Notes (Signed)
CSN: [id number]     Arrival date & time 09/16/13  4098 History   First MD Initiated Contact with Patient 09/16/13 [phone]     Chief Complaint  Patient presents with  . Post-op Problem     (Consider location/radiation/quality/duration/timing/severity/associated sxs/prior Treatment) The history is provided by the patient.  63 -year-old male who is 3 days post prostatectomy done at [facility] was doing well until this evening when he started having swelling of his penis. He has noted a small amount of drainage around the Foley catheter is also noted some swelling going into the pelvis. He states that it is not painful. He tried talking with the physician on-call at [facility] but was not able to ascend a picture to the persons they came in for evaluation. He is concerned that there might be some kind of a blockage. He denies fever or chills. There's been no nausea or vomiting.  Past Medical History  Diagnosis Date  . ALLERGIC RHINITIS 04/19/2009  . ASTHMA 04/19/2009  . COLONIC POLYPS, HX OF 04/19/2009  . OSTEOARTHRITIS 04/19/2009  . Eczema    Past Surgical History  Procedure Laterality Date  . Joint replacement      left hip  . Prostatectomy     No family history on file. History  Substance Use Topics  . Smoking status: Former Smoker -- 1.00 packs/day    Quit date: 07/03/2011  . Smokeless tobacco: Never Used  . Alcohol Use: No    Review of Systems  All other systems reviewed and are negative.      Allergies  Horse-derived products  Home Medications   Current Outpatient Rx  Name  Route  Sig  Dispense  Refill  . albuterol (PROAIR HFA) 108 (90 BASE) MCG/ACT inhaler   Inhalation   Inhale 2 puffs into the lungs every 6 (six) hours as needed for wheezing.   17 g   5   . budesonide (PULMICORT) 180 MCG/ACT inhaler   Inhalation   Inhale 1 puff into the lungs 2 (two) times daily.   1 Inhaler   4   . cyclobenzaprine (FLEXERIL) 10 MG tablet   Oral   Take 10 mg by  mouth 3 (three) times daily as needed.         . fluticasone (FLONASE) 50 MCG/ACT nasal spray   Nasal   Place 2 sprays into the nose daily.   16 g   6   . hydrOXYzine (ATARAX/VISTARIL) 25 MG tablet      TAKE AS DIRECTED AS NEEDED FOR ITCHING   60 tablet   1   . montelukast (SINGULAIR) 10 MG tablet   Oral   Take 1 tablet (10 mg total) by mouth at bedtime.   30 tablet   3   . tadalafil (CIALIS) 20 MG tablet      TAKE 1 TABLET BY MOUTH EVERY DAY AS NEEDED   10 tablet   3   . triamcinolone cream (KENALOG) 0.1 %      APPLY TWICE DAILY AS DIRECTED   454 g   3    BP 146/80  Pulse 96  Temp(Src) 98.7 F (37.1 C) (Oral)  Resp 18  SpO2 95% Physical Exam  Nursing note and vitals reviewed.  63 year old male, resting comfortably and in no acute distress. Vital signs are significant for mild hypertension with blood pressure 146/80. Oxygen saturation is 95%, which is normal. Head is normocephalic and atraumatic. PERRLA, EOMI. Oropharynx is clear. Neck is nontender  and supple without adenopathy or JVD. Back is nontender and there is no CVA tenderness. Lungs are clear without rales, wheezes, or rhonchi. Chest is nontender. Heart has regular rate and rhythm without murmur. Abdomen is soft, flat, nontender without masses or hepatosplenomegaly and peristalsis is normoactive. Healing scars are present from recent surgery. Extensive ecchymosis is present which is most prominent on the right side. Genitalia: Circumcised penis with Foley catheter in place. Minimal amount of mucoid drainage around the Foley catheter. There is moderate edema of the penis but the corpora are normal to palpation. Foley catheter has reddish urine present. There is no erythema or warmth. There is also some slight swelling of the pubic area. Extremities have no cyanosis or edema, full range of motion is present. Skin is warm and dry without rash. Neurologic: Mental status is normal, cranial nerves are intact,  there are no motor or sensory deficits.  ED Course  Procedures (including critical care time) Labs Review Results for orders placed during the hospital encounter of 09/16/13  URINALYSIS, ROUTINE W REFLEX MICROSCOPIC      Result Value Ref Range   Color, Urine RED (*) YELLOW   APPearance CLOUDY (*) CLEAR   Specific Gravity, Urine 1.020  1.005 - 1.030   pH 5.5  5.0 - 8.0   Glucose, UA NEGATIVE  NEGATIVE mg/dL   Hgb urine dipstick LARGE (*) NEGATIVE   Bilirubin Urine NEGATIVE  NEGATIVE   Ketones, ur NEGATIVE  NEGATIVE mg/dL   Protein, ur 100 (*) NEGATIVE mg/dL   Urobilinogen, UA 1.0  0.0 - 1.0 mg/dL   Nitrite NEGATIVE  NEGATIVE   Leukocytes, UA MODERATE (*) NEGATIVE  CBC WITH DIFFERENTIAL      Result Value Ref Range   WBC 8.5  4.0 - 10.5 K/uL   RBC 3.90 (*) 4.22 - 5.81 MIL/uL   Hemoglobin 12.5 (*) 13.0 - 17.0 g/dL   HCT 37.6 (*) 39.0 - 52.0 %   MCV 96.4  78.0 - 100.0 fL   MCH 32.1  26.0 - 34.0 pg   MCHC 33.2  30.0 - 36.0 g/dL   RDW 14.1  11.5 - 15.5 %   Platelets 232  150 - 400 K/uL   Neutrophils Relative % 65  43 - 77 %   Neutro Abs 5.5  1.7 - 7.7 K/uL   Lymphocytes Relative 19  12 - 46 %   Lymphs Abs 1.6  0.7 - 4.0 K/uL   Monocytes Relative 13 (*) 3 - 12 %   Monocytes Absolute 1.1 (*) 0.1 - 1.0 K/uL   Eosinophils Relative 3  0 - 5 %   Eosinophils Absolute 0.3  0.0 - 0.7 K/uL   Basophils Relative 0  0 - 1 %   Basophils Absolute 0.0  0.0 - 0.1 K/uL  BASIC METABOLIC PANEL      Result Value Ref Range   Sodium 134 (*) 137 - 147 mEq/L   Potassium 3.6 (*) 3.7 - 5.3 mEq/L   Chloride 94 (*) 96 - 112 mEq/L   CO2 28  19 - 32 mEq/L   Glucose, Bld 101 (*) 70 - 99 mg/dL   BUN 21  6 - 23 mg/dL   Creatinine, Ser 1.38 (*) 0.50 - 1.35 mg/dL   Calcium 8.9  8.4 - 10.5 mg/dL   GFR calc non Af Amer 53 (*) >90 mL/min   GFR calc Af Amer 61 (*) >90 mL/min  URINE MICROSCOPIC-ADD ON      Result Value Ref Range  WBC, UA 3-6  <3 WBC/hpf   RBC / HPF TOO NUMEROUS TO COUNT  <3 RBC/hpf    Urine-Other URINALYSIS PERFORMED ON SUPERNATANT     Imaging Review Ct Pelvis W Contrast  09/16/2013   CLINICAL DATA:  Three days post prostatectomy, presenting with pelvic pain, swelling involving the penis, and drainage around the indwelling Foley catheter.  EXAM: CT PELVIS WITH CONTRAST  TECHNIQUE: Multidetector CT imaging of the pelvis was performed using the standard protocol following the bolus administration of intravenous contrast.  CONTRAST:  164mL OMNIPAQUE IOHEXOL 300 MG/ML IV. Oral contrast was also administered.  COMPARISON:  CT PELVIS W/CM dated 02/10/2007  FINDINGS: Post operative fluid collection in the prostatectomy bed, partially obscured by the metallic beam hardening streak artifact from the left hip prosthesis. This is simple fluid with a Hounsfield measurement of less than 10. The collection measures approximately 4.0 x 4.9 cm.  Extensive edema/fluid involving the penis. The fluid/edema surrounds the corpora. No visible hydroceles.  Gas bubbles within the left side of the scrotum and the left inguinal canal, not necessarily unexpected postoperatively. Urinary bladder decompressed by Foley catheter. Symmetric bilateral mixed attenuation collections in the retroperitoneum of the low pelvis, that on the right measuring approximately 4.1 x 2.7 cm and that on the left measuring approximately 4.4 x 2.5 cm (series 2, image 27). There are gas bubbles present in these collections. No abnormal fluid collections elsewhere in the pelvis. Scattered sigmoid colon diverticula without evidence of acute diverticulitis. Visualized small bowel unremarkable. Normal appendix in the right upper pelvis.  Bone window images demonstrate the prior left hip arthroplasty with anatomic alignment. Moderate degenerative changes in the right hip. Severe degenerative changes involving the facet joints at L4-5 and L5-S1.  IMPRESSION: 1. Postoperative fluid collection in the prostatectomy bed, not unexpected. 2. Symmetric  heterogeneous collections in the retroperitoneum of the low pelvis bilaterally, containing gas bubbles, query small hematomas. 3. Extensive edema/fluid involving the penis, surrounding the corpora. 4. Gas bubbles in the left side of the scrotum and in the left inguinal canal, likely expected postoperative change.   Electronically Signed   By: Evangeline Dakin M.D.   On: 09/16/2013 05:15   Images viewed by me.  MDM   Final diagnoses:  Edema of penis    Edema the penis and pelvic area in the postoperative period following prostatectomy. He will be sent for CT to make sure there is no acute surgical problem.  CT shows no complication of surgery. Postoperative changes are present. Patient is reassured and is discharged with instructions to followup with his urologist.  Delora Fuel, MD 85/27/78 [id number]

## 2013-09-16 NOTE — Discharge Instructions (Signed)
Your CAT scan did not show evidence of any problem with the recent surgery. Please followup with your physician at [facility] regarding the swelling. Return to the ED if you're having any problems.

## 2013-09-16 NOTE — ED Notes (Signed)
Pt underwent a prostatectomy 3 days ago, began having pain and swelling to penis with drainage around catheter. Pt also c/o pain and swelling to abdomen.

## 2013-10-06 ENCOUNTER — Other Ambulatory Visit: Payer: 59

## 2013-10-08 ENCOUNTER — Emergency Department (HOSPITAL_COMMUNITY): Payer: 59

## 2013-10-08 ENCOUNTER — Encounter (HOSPITAL_COMMUNITY): Payer: Self-pay | Admitting: Emergency Medicine

## 2013-10-08 ENCOUNTER — Inpatient Hospital Stay (HOSPITAL_COMMUNITY)
Admission: EM | Admit: 2013-10-08 | Discharge: 2013-10-11 | DRG: 872 | Disposition: A | Discharge status: Home / Self Care | Payer: 59 | Attending: Internal Medicine | Admitting: Internal Medicine

## 2013-10-08 DIAGNOSIS — A419 Sepsis, unspecified organism: Secondary | ICD-10-CM

## 2013-10-08 DIAGNOSIS — N453 Epididymo-orchitis: Secondary | ICD-10-CM

## 2013-10-08 DIAGNOSIS — IMO0002 Reserved for concepts with insufficient information to code with codable children: Secondary | ICD-10-CM | POA: Diagnosis present

## 2013-10-08 DIAGNOSIS — J309 Allergic rhinitis, unspecified: Secondary | ICD-10-CM

## 2013-10-08 DIAGNOSIS — Y836 Removal of other organ (partial) (total) as the cause of abnormal reaction of the patient, or of later complication, without mention of misadventure at the time of the procedure: Secondary | ICD-10-CM | POA: Diagnosis present

## 2013-10-08 DIAGNOSIS — K567 Ileus, unspecified: Secondary | ICD-10-CM

## 2013-10-08 DIAGNOSIS — Z79899 Other long term (current) drug therapy: Secondary | ICD-10-CM

## 2013-10-08 DIAGNOSIS — Z87891 Personal history of nicotine dependence: Secondary | ICD-10-CM

## 2013-10-08 DIAGNOSIS — N5089 Other specified disorders of the male genital organs: Secondary | ICD-10-CM | POA: Diagnosis present

## 2013-10-08 DIAGNOSIS — N452 Orchitis: Secondary | ICD-10-CM

## 2013-10-08 DIAGNOSIS — C61 Malignant neoplasm of prostate: Secondary | ICD-10-CM | POA: Diagnosis present

## 2013-10-08 DIAGNOSIS — R109 Unspecified abdominal pain: Secondary | ICD-10-CM

## 2013-10-08 DIAGNOSIS — M1990 Unspecified osteoarthritis, unspecified site: Secondary | ICD-10-CM | POA: Diagnosis present

## 2013-10-08 DIAGNOSIS — I898 Other specified noninfective disorders of lymphatic vessels and lymph nodes: Secondary | ICD-10-CM | POA: Diagnosis present

## 2013-10-08 DIAGNOSIS — N12 Tubulo-interstitial nephritis, not specified as acute or chronic: Secondary | ICD-10-CM

## 2013-10-08 DIAGNOSIS — Z96649 Presence of unspecified artificial hip joint: Secondary | ICD-10-CM

## 2013-10-08 DIAGNOSIS — K560 Paralytic ileus: Secondary | ICD-10-CM

## 2013-10-08 DIAGNOSIS — N390 Urinary tract infection, site not specified: Secondary | ICD-10-CM | POA: Diagnosis present

## 2013-10-08 DIAGNOSIS — K5900 Constipation, unspecified: Secondary | ICD-10-CM | POA: Diagnosis present

## 2013-10-08 DIAGNOSIS — J45909 Unspecified asthma, uncomplicated: Secondary | ICD-10-CM | POA: Diagnosis present

## 2013-10-08 DIAGNOSIS — N433 Hydrocele, unspecified: Secondary | ICD-10-CM | POA: Diagnosis present

## 2013-10-08 DIAGNOSIS — Z9079 Acquired absence of other genital organ(s): Secondary | ICD-10-CM

## 2013-10-08 DIAGNOSIS — Z791 Long term (current) use of non-steroidal anti-inflammatories (NSAID): Secondary | ICD-10-CM

## 2013-10-08 DIAGNOSIS — N451 Epididymitis: Secondary | ICD-10-CM

## 2013-10-08 DIAGNOSIS — A4152 Sepsis due to Pseudomonas: Principal | ICD-10-CM | POA: Diagnosis present

## 2013-10-08 LAB — URINE MICROSCOPIC-ADD ON

## 2013-10-08 LAB — COMPREHENSIVE METABOLIC PANEL
ALT: 43 U/L (ref 0–53)
AST: 41 U/L — AB (ref 0–37)
Albumin: 3.5 g/dL (ref 3.5–5.2)
Alkaline Phosphatase: 99 U/L (ref 39–117)
BILIRUBIN TOTAL: 0.7 mg/dL (ref 0.3–1.2)
BUN: 12 mg/dL (ref 6–23)
CHLORIDE: 95 meq/L — AB (ref 96–112)
CO2: 27 meq/L (ref 19–32)
Calcium: 9.7 mg/dL (ref 8.4–10.5)
Creatinine, Ser: 1.05 mg/dL (ref 0.50–1.35)
GFR calc Af Amer: 85 mL/min — ABNORMAL LOW (ref >90)
GFR, EST NON AFRICAN AMERICAN: 74 mL/min — AB (ref >90)
Glucose, Bld: 126 mg/dL — ABNORMAL HIGH (ref 70–99)
POTASSIUM: 4.6 meq/L (ref 3.7–5.3)
SODIUM: 135 meq/L — AB (ref 137–147)
Total Protein: 8.8 g/dL — ABNORMAL HIGH (ref 6.0–8.3)

## 2013-10-08 LAB — CBC WITH DIFFERENTIAL/PLATELET
BASOS PCT: 0 % (ref 0–1)
Basophils Absolute: 0 10*3/uL (ref 0.0–0.1)
Eosinophils Absolute: 0 10*3/uL (ref 0.0–0.7)
Eosinophils Relative: 0 % (ref 0–5)
HEMATOCRIT: 42.4 % (ref 39.0–52.0)
Hemoglobin: 14.2 g/dL (ref 13.0–17.0)
LYMPHS PCT: 7 % — AB (ref 12–46)
Lymphs Abs: 1.4 10*3/uL (ref 0.7–4.0)
MCH: 32.7 pg (ref 26.0–34.0)
MCHC: 33.5 g/dL (ref 30.0–36.0)
MCV: 97.7 fL (ref 78.0–100.0)
Monocytes Absolute: 1.5 10*3/uL — ABNORMAL HIGH (ref 0.1–1.0)
Monocytes Relative: 7 % (ref 3–12)
NEUTROS ABS: 17.6 10*3/uL — AB (ref 1.7–7.7)
NEUTROS PCT: 86 % — AB (ref 43–77)
PLATELETS: 397 10*3/uL (ref 150–400)
RBC: 4.34 MIL/uL (ref 4.22–5.81)
RDW: 14.9 % (ref 11.5–15.5)
WBC: 20.5 10*3/uL — AB (ref 4.0–10.5)

## 2013-10-08 LAB — POC OCCULT BLOOD, ED: FECAL OCCULT BLD: NEGATIVE

## 2013-10-08 LAB — PROCALCITONIN: Procalcitonin: 0.23 ng/mL

## 2013-10-08 LAB — URINALYSIS, ROUTINE W REFLEX MICROSCOPIC
Bilirubin Urine: NEGATIVE
GLUCOSE, UA: NEGATIVE mg/dL
Ketones, ur: NEGATIVE mg/dL
Nitrite: POSITIVE — AB
PROTEIN: 100 mg/dL — AB
Specific Gravity, Urine: 1.028 (ref 1.005–1.030)
Urobilinogen, UA: 0.2 mg/dL (ref 0.0–1.0)
pH: 8.5 — ABNORMAL HIGH (ref 5.0–8.0)

## 2013-10-08 LAB — I-STAT CG4 LACTIC ACID, ED: Lactic Acid, Venous: 1.52 mmol/L (ref 0.5–2.2)

## 2013-10-08 LAB — MRSA PCR SCREENING: MRSA by PCR: NEGATIVE

## 2013-10-08 MED ORDER — HYDROCODONE-ACETAMINOPHEN 5-325 MG PO TABS
1.0000 | ORAL_TABLET | ORAL | Status: DC | PRN
  Administered 2013-10-08 – 2013-10-11 (×11): 2 via ORAL
  Filled 2013-10-08 (×12): qty 2

## 2013-10-08 MED ORDER — IOHEXOL 300 MG/ML  SOLN
100.0000 mL | Freq: Once | INTRAMUSCULAR | Status: AC | PRN
  Administered 2013-10-08: 100 mL via INTRAVENOUS

## 2013-10-08 MED ORDER — ACETAMINOPHEN 325 MG PO TABS: 650.0000 mg | ORAL_TABLET | Freq: Four times a day (QID) | ORAL | Status: DC | PRN

## 2013-10-08 MED ORDER — HYDROMORPHONE HCL PF 1 MG/ML IJ SOLN
1.0000 mg | Freq: Once | INTRAMUSCULAR | Status: AC
  Administered 2013-10-08: 1 mg via INTRAVENOUS
  Filled 2013-10-08: qty 1

## 2013-10-08 MED ORDER — HYDROMORPHONE HCL PF 1 MG/ML IJ SOLN
1.0000 mg | INTRAMUSCULAR | Status: DC | PRN
  Administered 2013-10-08: 1 mg via INTRAVENOUS
  Filled 2013-10-08: qty 1

## 2013-10-08 MED ORDER — ADULT MULTIVITAMIN W/MINERALS CH
1.0000 | ORAL_TABLET | Freq: Every day | ORAL | Status: DC
  Administered 2013-10-09 – 2013-10-11 (×3): 1 via ORAL
  Filled 2013-10-08 (×3): qty 1

## 2013-10-08 MED ORDER — MORPHINE SULFATE 4 MG/ML IJ SOLN
4.0000 mg | Freq: Once | INTRAMUSCULAR | Status: AC
  Administered 2013-10-08: 4 mg via INTRAVENOUS
  Filled 2013-10-08: qty 1

## 2013-10-08 MED ORDER — ONDANSETRON HCL 4 MG PO TABS: 4.0000 mg | ORAL_TABLET | Freq: Four times a day (QID) | ORAL | Status: DC | PRN

## 2013-10-08 MED ORDER — SODIUM CHLORIDE 0.9 % IV BOLUS (SEPSIS): 1000.0000 mL | Freq: Once | INTRAVENOUS | Status: DC

## 2013-10-08 MED ORDER — HYDROMORPHONE HCL PF 1 MG/ML IJ SOLN
1.0000 mg | INTRAMUSCULAR | Status: DC | PRN
  Administered 2013-10-08 – 2013-10-11 (×24): 1 mg via INTRAVENOUS
  Filled 2013-10-08 (×24): qty 1

## 2013-10-08 MED ORDER — ONDANSETRON HCL 4 MG/2ML IJ SOLN
4.0000 mg | Freq: Once | INTRAMUSCULAR | Status: AC
  Administered 2013-10-08: 4 mg via INTRAVENOUS
  Filled 2013-10-08 (×2): qty 2

## 2013-10-08 MED ORDER — BISACODYL 10 MG RE SUPP: 10.0000 mg | Freq: Every day | RECTAL | Status: DC | PRN

## 2013-10-08 MED ORDER — ACETAMINOPHEN 650 MG RE SUPP: 650.0000 mg | Freq: Four times a day (QID) | RECTAL | Status: DC | PRN

## 2013-10-08 MED ORDER — SODIUM CHLORIDE 0.9 % IV SOLN
INTRAVENOUS | Status: DC
  Administered 2013-10-08: 16:00:00 via INTRAVENOUS

## 2013-10-08 MED ORDER — SENNOSIDES-DOCUSATE SODIUM 8.6-50 MG PO TABS
1.0000 | ORAL_TABLET | Freq: Every evening | ORAL | Status: DC | PRN
Start: 2013-10-08 — End: 2013-10-11

## 2013-10-08 MED ORDER — IOHEXOL 300 MG/ML  SOLN
50.0000 mL | Freq: Once | INTRAMUSCULAR | Status: AC | PRN
  Administered 2013-10-08: 50 mL via ORAL

## 2013-10-08 MED ORDER — DOCUSATE SODIUM 100 MG PO CAPS
100.0000 mg | ORAL_CAPSULE | Freq: Two times a day (BID) | ORAL | Status: DC
  Administered 2013-10-08 – 2013-10-11 (×6): 100 mg via ORAL
  Filled 2013-10-08 (×7): qty 1

## 2013-10-08 MED ORDER — VITAMIN C 500 MG PO TABS
500.0000 mg | ORAL_TABLET | Freq: Every day | ORAL | Status: DC
  Administered 2013-10-08 – 2013-10-11 (×4): 500 mg via ORAL
  Filled 2013-10-08 (×4): qty 1

## 2013-10-08 MED ORDER — ALUM & MAG HYDROXIDE-SIMETH 200-200-20 MG/5ML PO SUSP: 30.0000 mL | Freq: Four times a day (QID) | ORAL | Status: DC | PRN

## 2013-10-08 MED ORDER — PIPERACILLIN-TAZOBACTAM 3.375 G IVPB
3.3750 g | Freq: Three times a day (TID) | INTRAVENOUS | Status: DC
  Administered 2013-10-09 – 2013-10-11 (×7): 3.375 g via INTRAVENOUS
  Filled 2013-10-08 (×10): qty 50

## 2013-10-08 MED ORDER — MORPHINE SULFATE 4 MG/ML IJ SOLN
4.0000 mg | Freq: Once | INTRAMUSCULAR | Status: DC
Start: 2013-10-08 — End: 2013-10-11
  Filled 2013-10-08 (×2): qty 1

## 2013-10-08 MED ORDER — ONDANSETRON HCL 4 MG/2ML IJ SOLN
4.0000 mg | Freq: Once | INTRAMUSCULAR | Status: AC
  Administered 2013-10-08: 4 mg via INTRAVENOUS
  Filled 2013-10-08: qty 2

## 2013-10-08 MED ORDER — PIPERACILLIN-TAZOBACTAM 3.375 G IVPB 30 MIN
3.3750 g | Freq: Once | INTRAVENOUS | Status: AC
  Administered 2013-10-08: 3.375 g via INTRAVENOUS
  Filled 2013-10-08: qty 50

## 2013-10-08 MED ORDER — SODIUM CHLORIDE 0.9 % IV BOLUS (SEPSIS)
1000.0000 mL | Freq: Once | INTRAVENOUS | Status: AC
  Administered 2013-10-08: 1000 mL via INTRAVENOUS

## 2013-10-08 MED ORDER — ACETAMINOPHEN 500 MG PO TABS
1000.0000 mg | ORAL_TABLET | Freq: Once | ORAL | Status: AC
  Administered 2013-10-08: 1000 mg via ORAL
  Filled 2013-10-08: qty 2

## 2013-10-08 MED ORDER — SODIUM CHLORIDE 0.9 % IV SOLN
INTRAVENOUS | Status: DC
  Administered 2013-10-08 – 2013-10-10 (×5): via INTRAVENOUS

## 2013-10-08 MED ORDER — ACETAMINOPHEN 325 MG PO TABS: 650.0000 mg | ORAL_TABLET | Freq: Once | ORAL | Status: DC

## 2013-10-08 MED ORDER — VANCOMYCIN HCL 10 G IV SOLR
2000.0000 mg | Freq: Once | INTRAVENOUS | Status: AC
  Administered 2013-10-08: 2000 mg via INTRAVENOUS
  Filled 2013-10-08: qty 2000

## 2013-10-08 MED ORDER — ONDANSETRON HCL 4 MG/2ML IJ SOLN
4.0000 mg | Freq: Four times a day (QID) | INTRAMUSCULAR | Status: DC | PRN
  Administered 2013-10-09: 4 mg via INTRAVENOUS
  Filled 2013-10-08: qty 2

## 2013-10-08 MED ORDER — VANCOMYCIN HCL 10 G IV SOLR
1250.0000 mg | Freq: Two times a day (BID) | INTRAVENOUS | Status: DC
  Administered 2013-10-09 – 2013-10-10 (×3): 1250 mg via INTRAVENOUS
  Filled 2013-10-08 (×5): qty 1250

## 2013-10-08 MED ORDER — ALBUTEROL SULFATE (2.5 MG/3ML) 0.083% IN NEBU: 2.5000 mg | INHALATION_SOLUTION | Freq: Four times a day (QID) | RESPIRATORY_TRACT | Status: DC | PRN

## 2013-10-08 MED ORDER — ALBUTEROL SULFATE HFA 108 (90 BASE) MCG/ACT IN AERS: 2.0000 | INHALATION_SPRAY | Freq: Four times a day (QID) | RESPIRATORY_TRACT | Status: DC | PRN

## 2013-10-08 NOTE — ED Notes (Signed)
Pt reports hx of prostate cancer, had prostate removed March 19th, on 3/21 pt came to ED for penile edema and had sonogram,  pt reports on Friday April 4/11 pt started having left sided abdominal pain that has progressively increased, now radiates to back and across stomach 10/10. Pt reports vomited yesterday, intermittent hematuria, reports scrotal swelling and pain. Reports fever at home 101. Pt has constipation, "can't put any pressure to evacuate poop out" because of the pain. Has tried enemas, suppositories, prune juice, and milk of magnesia with no relief. Last normal bowel movement on 4/10.

## 2013-10-08 NOTE — Progress Notes (Addendum)
ANTIBIOTIC CONSULT NOTE - INITIAL  Pharmacy Consult for Vancomycin Indication: Sepsis  Allergies  Allergen Reactions  . Horse-Derived Products Anaphylaxis    Patient Measurements:   Adjusted Body Weight:   Vital Signs: Temp: 100.5 F (38.1 C) (04/12 1802) Temp src: Oral (04/12 1802) BP: 140/78 mmHg (04/12 1802) Pulse Rate: 107 (04/12 1802) Intake/Output from previous day:   Intake/Output from this shift:    Labs:  Recent Labs  10/08/13 1530  WBC 20.5*  HGB 14.2  PLT 397  CREATININE 1.05   The CrCl is unknown because both a height and weight (above a minimum accepted value) are required for this calculation. No results found for this basename: VANCOTROUGH, VANCOPEAK, VANCORANDOM, GENTTROUGH, GENTPEAK, GENTRANDOM, TOBRATROUGH, TOBRAPEAK, TOBRARND, AMIKACINPEAK, AMIKACINTROU, AMIKACIN,  in the last 72 hours   Microbiology: No results found for this or any previous visit (from the past 720 hour(s)).  Medical History: Past Medical History  Diagnosis Date  . ALLERGIC RHINITIS 04/19/2009  . ASTHMA 04/19/2009  . COLONIC POLYPS, HX OF 04/19/2009  . OSTEOARTHRITIS 04/19/2009  . Eczema     Assessment: 10 yoM with PMHx prostate cancer s/p prostatectomy 3/18 presents with abdominal, testicular, and penile swelling x 1 day.  In ED, found to be septic, zosyn given x 1, and pharmacy consulted to dose vancomycin and zosyn  4/12 >> Vanc  >> 4/12 >> Zosyn >>   Tmax: 100.5 WBCs:  20.5K Renal: SCr 1.05, CrCl 81.8ml/min (N72)  4/12 blood: Collected 4/12 urine: Collected   Goal of Therapy:  Vancomycin trough level 15-20 mcg/ml  Plan:  Vancomycin 2g IV x 1, then 1250mg  IV q12h Zosyn 3.375g IV q8h (infuse over 4 hours) F/u renal fxn, VT at Css, cultures, clinical course  Ralene Bathe, PharmD, BCPS 10/08/2013, 7:32 PM  Pager: [phone]

## 2013-10-08 NOTE — ED Notes (Signed)
Pt attempted to provide urine specimen pt unable to do so at this time PA made aware

## 2013-10-08 NOTE — H&P (Signed)
History and Physical       Hospital Admission Note Date: 10/08/2013  Patient name: [name] Medical record number: [id number] Date of birth: [birth year] Age: 63 y.o. Gender: male  PCP: [name], MD    Chief Complaint:  Abdominal pain with fever, intermittent hematuria, for last 2 days  HPI: Patient is a 63 year old male with history of recent diagnosis of prostrate cancer, diagnosed in December 2014 and hadradical prostatectomy with bilateral pelvic lymphadenectomy performed on 09/13/2013 by Dr. Valentina Gu at [facility]. Patient reported that he has been having abdominal bloating, pain in the left flank and scrotal edema and swelling, started on Friday 2 days ago. Patient was earlier seen on 09/16/2013 in the ER and he followed up with Dr. Valentina Gu, was doing well after that. Patient reported that he was taking Percocet for the pain postoperatively. He has finished the Percocet and ibuprofen was not providing him enough relief. He also had some constipation and was taking laxatives. He did have a bowel movement this morning. He had 101 fever at home, nausea with one episode of emesis yesterday. In the ER CBC showed white count of 20.5, lactic acid 1.5, sodium 135, BUN 12, creatinine 1.05 CT abdomen and pelvis showed evolving postoperative pelvic fluid collections, likely seromas or lymphocele, fluid-filled colon, Likely ileus UA positive for UTI  Review of Systems:  Constitutional: Denies fever, chills, diaphoresis,++ poor appetite and fatigue.  HEENT: Denies photophobia, eye pain, redness, hearing loss, ear pain, congestion, sore throat, rhinorrhea, sneezing, mouth sores, trouble swallowing, neck pain, neck stiffness and tinnitus.   Respiratory: Denies SOB, DOE, cough, chest tightness,  and wheezing.   Cardiovascular: Denies chest pain, palpitations and leg swelling.  Gastrointestinal: Please see history of present illness   Genitourinary: Please see history of present illness Musculoskeletal: Denies myalgias, back pain, joint swelling, arthralgias and gait problem.  Skin: Denies pallor, rash and wound.  Neurological: Denies dizziness, seizures, syncope, weakness, light-headedness, numbness and headaches.  Hematological: Denies adenopathy. Easy bruising, personal or family bleeding history  Psychiatric/Behavioral: Denies suicidal ideation, mood changes, confusion, nervousness, sleep disturbance and agitation  Past Medical History: Past Medical History  Diagnosis Date  . ALLERGIC RHINITIS 04/19/2009  . ASTHMA 04/19/2009  . COLONIC POLYPS, HX OF 04/19/2009  . OSTEOARTHRITIS 04/19/2009  . Eczema    Past Surgical History  Procedure Laterality Date  . Joint replacement      left hip  . Prostatectomy      September 14, 2013    Medications: Prior to Admission medications   Medication Sig Start Date End Date Taking? Authorizing Provider  albuterol (PROAIR HFA) 108 (90 BASE) MCG/ACT inhaler Inhale 2 puffs into the lungs every 6 (six) hours as needed for wheezing. 10/13/12  Yes Marletta Lor, MD  ibuprofen (ADVIL,MOTRIN) 200 MG tablet Take 400 mg by mouth every 6 (six) hours as needed for moderate pain.   Yes Historical Provider, MD  ibuprofen (ADVIL,MOTRIN) 800 MG tablet Take 800 mg by mouth every 6 (six) hours as needed (pain).   Yes Historical Provider, MD  Multiple Vitamin (MULTIVITAMIN WITH MINERALS) TABS tablet Take 1 tablet by mouth daily.   Yes Historical Provider, MD  Omega-3 Fatty Acids (FISH OIL) 1000 MG CAPS Take 2 capsules by mouth daily.   Yes Historical Provider, MD  oxyCODONE-acetaminophen (PERCOCET/ROXICET) 5-325 MG per tablet Take 1-2 tablets by mouth every 4 (four) hours as needed for severe pain (pain).   Yes Historical Provider, MD  senna-docusate (SENNA PLUS) 8.6-50 MG per  tablet Take 1 tablet by mouth daily as needed for mild constipation (constipation).   Yes Historical Provider, MD   triamcinolone cream (KENALOG) 0.1 % Apply 1 application topically 2 (two) times daily as needed.   Yes Historical Provider, MD  vitamin C (ASCORBIC ACID) 500 MG tablet Take 500 mg by mouth daily.   Yes Historical Provider, MD    Allergies:   Allergies  Allergen Reactions  . Horse-Derived Products Anaphylaxis    Social History:  reports that he quit smoking about 2 years ago. He has never used smokeless tobacco. He reports that he does not drink alcohol or use illicit drugs.  Family History: No family history on file.  Physical Exam: Blood pressure 140/78, pulse 107, temperature 100.5 F (38.1 C), temperature source Oral, resp. rate 16, height 5\' 10"  (1.778 m), weight 90.719 kg (200 lb), SpO2 98.00%. General: Alert, awake, oriented x3, in no acute distress. HEENT: normocephalic, atraumatic, anicteric sclera, pink conjunctiva, pupils equal and reactive to light and accomodation, oropharynx clear Neck: supple, no masses or lymphadenopathy, no goiter, no bruits  Heart: Regular rate and rhythm, without murmurs, rubs or gallops. Lungs: Clear to auscultation bilaterally, no wheezing, rales or rhonchi. Abdomen: Soft, mild tenderness in the left flank area, mild distention, positive bowel sounds, no masses. Extremities: No clubbing, cyanosis or edema with positive pedal pulses. Neuro: Grossly intact, no focal neurological deficits, strength 5/5 upper and lower extremities bilaterally Psych: alert and oriented x 3, normal mood and affect Skin: no rashes or lesions, warm and dry   LABS on Admission:  Basic Metabolic Panel:  Recent Labs Lab 10/08/13 1530  NA 135*  K 4.6  CL 95*  CO2 27  GLUCOSE 126*  BUN 12  CREATININE 1.05  CALCIUM 9.7   Liver Function Tests:  Recent Labs Lab 10/08/13 1530  AST 41*  ALT 43  ALKPHOS 99  BILITOT 0.7  PROT 8.8*  ALBUMIN 3.5   No results found for this basename: LIPASE, AMYLASE,  in the last 168 hours No results found for this basename:  AMMONIA,  in the last 168 hours CBC:  Recent Labs Lab 10/08/13 1530  WBC 20.5*  NEUTROABS 17.6*  HGB 14.2  HCT 42.4  MCV 97.7  PLT 397   Cardiac Enzymes: No results found for this basename: CKTOTAL, CKMB, CKMBINDEX, TROPONINI,  in the last 168 hours BNP: No components found with this basename: POCBNP,  CBG: No results found for this basename: GLUCAP,  in the last 168 hours   Radiological Exams on Admission: Dg Chest 2 View  10/08/2013   CLINICAL DATA:  Shortness of breath and pain with history of asthma and prostate malignancy  EXAM: CHEST  2 VIEW  COMPARISON:  DG CHEST 1V PORT dated 10/05/2006  FINDINGS: The lungs are adequately inflated. There is no focal pneumonia. Subtle nodularity projects in the region of the nipples bilaterally lying at the level of the posterior eighth ribs bilaterally. The cardiac silhouette is normal in size. The pulmonary vascularity is not engorged. The mediastinum is normal in width. There is no pleural effusion. There is mild tortuosity of the descending thoracic aorta. The observed portions of the bony thorax exhibit no acute abnormalities.  IMPRESSION: There findings which likely reflect known reactive airway disease or COPD. There is no evidence of pneumonia nor CHF or other acute cardiopulmonary abnormality.   Electronically Signed   By: David  Martinique   On: 10/08/2013 17:52   Dg Pelvis 1-2 Views  10/08/2013  CLINICAL DATA:  Groin swelling ; history of prostate malignancy.  EXAM: PELVIS - 1-2 VIEW  COMPARISON:  None.  FINDINGS: The bony pelvis appears adequately mineralized. There is no lytic nor blastic lesion or evidence of an acute fracture. 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 There are degenerative changes of the right hip joint. There is contrast in the nondistended urinary bladder. There may be mass effect upon the lateral aspects of the urinary bladder. There are degenerative changes of the lower lumbar spine.  IMPRESSION: There is no acute bony  abnormality of the pelvis.   Electronically Signed   By: David  Martinique   On: 10/08/2013 17:59   Ct Pelvis W Contrast  09/16/2013   CLINICAL DATA:  Three days post prostatectomy, presenting with pelvic pain, swelling involving the penis, and drainage around the indwelling Foley catheter.  EXAM: CT PELVIS WITH CONTRAST  TECHNIQUE: Multidetector CT imaging of the pelvis was performed using the standard protocol following the bolus administration of intravenous contrast.  CONTRAST:  15mL OMNIPAQUE IOHEXOL 300 MG/ML IV. Oral contrast was also administered.  COMPARISON:  CT PELVIS W/CM dated 02/10/2007  FINDINGS: Post operative fluid collection in the prostatectomy bed, partially obscured by the metallic beam hardening streak artifact from the left hip prosthesis. This is simple fluid with a Hounsfield measurement of less than 10. The collection measures approximately 4.0 x 4.9 cm.  Extensive edema/fluid involving the penis. The fluid/edema surrounds the corpora. No visible hydroceles.  Gas bubbles within the left side of the scrotum and the left inguinal canal, not necessarily unexpected postoperatively. Urinary bladder decompressed by Foley catheter. Symmetric bilateral mixed attenuation collections in the retroperitoneum of the low pelvis, that on the right measuring approximately 4.1 x 2.7 cm and that on the left measuring approximately 4.4 x 2.5 cm (series 2, image 27). There are gas bubbles present in these collections. No abnormal fluid collections elsewhere in the pelvis. Scattered sigmoid colon diverticula without evidence of acute diverticulitis. Visualized small bowel unremarkable. Normal appendix in the right upper pelvis.  Bone window images demonstrate the prior left hip arthroplasty with anatomic alignment. Moderate degenerative changes in the right hip. Severe degenerative changes involving the facet joints at L4-5 and L5-S1.  IMPRESSION: 1. Postoperative fluid collection in the prostatectomy bed, not  unexpected. 2. Symmetric heterogeneous collections in the retroperitoneum of the low pelvis bilaterally, containing gas bubbles, query small hematomas. 3. Extensive edema/fluid involving the penis, surrounding the corpora. 4. Gas bubbles in the left side of the scrotum and in the left inguinal canal, likely expected postoperative change.   Electronically Signed   By: Evangeline Dakin M.D.   On: 09/16/2013 05:15   US Scrotum  10/08/2013   CLINICAL DATA:  Bilateral testicles swelling, rule out torsion  EXAM: SCROTAL ULTRASOUND  DOPPLER ULTRASOUND OF THE TESTICLES  TECHNIQUE: Complete ultrasound examination of the testicles, epididymis, and other scrotal structures was performed. Color and spectral Doppler ultrasound were also utilized to evaluate blood flow to the testicles.  COMPARISON:  None.  FINDINGS: Right testicle  Measurements: 47 x 19 x 24 mm. No mass or microlithiasis visualized.  Left testicle  Measurements: 40 x 23 x 16 mm. No mass or microlithiasis visualized.  Right epididymis:  Normal in size and appearance.  Left epididymis:  Mildly enlarged and hypervascular  Hydrocele: Tiny right hydrocele. Large complex left hydrocele with multiple septations.  Varicocele:  None visualized.  Pulsed Doppler interrogation of both testes demonstrates increased blood flow to both testicles.  IMPRESSION: Findings consistent with bilateral orchitis and left epididymitis, with large complex hydrocele left scrotum.   Electronically Signed   By: Skipper Cliche M.D.   On: 10/08/2013 17:37   Ct Abdomen Pelvis W Contrast  10/08/2013   CLINICAL DATA:  Increasing left-sided abdominal pain with vomiting and intermittent hematuria. Recent prostatectomy with subsequent penile swelling.  EXAM: CT ABDOMEN AND PELVIS WITH CONTRAST  TECHNIQUE: Multidetector CT imaging of the abdomen and pelvis was performed using the standard protocol following bolus administration of intravenous contrast.  CONTRAST:  132mL OMNIPAQUE IOHEXOL 300  MG/ML SOLN, 6mL OMNIPAQUE IOHEXOL 300 MG/ML SOLN  COMPARISON:  US SCROTUM dated 10/08/2013; CT PELVIS W/CM dated 09/16/2013; CT ABD W/CM dated 02/10/2007  FINDINGS: There is stable mild atelectasis or scarring in both lung bases. No significant pleural or pericardial effusion is present. There is a small hiatal hernia.  Hepatic steatosis appears mildly improved. There is no focal hepatic abnormality. The spleen, gallbladder, pancreas, adrenal glands and kidneys appear normal. There is no hydronephrosis.  The stomach and small bowel appear normal without significant distention or wall thickening. The colon is diffusely fluid filled, but shows no wall thickening or surrounding inflammation. The appendix appears normal.  Foley catheter has been removed. There is bladder wall thickening with mild residual perivesical soft tissue stranding. 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 Fluid collections posterior to both external iliac arteries also appear slightly smaller and better defined, measuring 3.3 x 2.1 cm on the right and 3.4 x 1.6 cm on the left. There is minimal residual air within the right-sided collection. There is no evidence of iliac vein DVT. Edematous changes within the perineum and upper scrotum appear improved compared with the recent study.  IMPRESSION: 1. Evolving postoperative pelvic fluid collections, likely seromas or lymphoceles. The perineal and perivesical soft tissue stranding noted previously have improved. 2. Fluid-filled colon, likely ileus. No evidence of bowel obstruction or new extraluminal fluid collection. 3. No acute abdominal findings.  Mild hepatic steatosis.   Electronically Signed   By: Camie Patience M.D.   On: 10/08/2013 18:09   [place] Art/ven Flow Abd Pelv Doppler  10/08/2013   CLINICAL DATA:  Bilateral testicles swelling, rule out  torsion  EXAM: SCROTAL ULTRASOUND  DOPPLER ULTRASOUND OF THE TESTICLES  TECHNIQUE: Complete ultrasound examination of the testicles, epididymis, and other scrotal structures was performed. Color and spectral Doppler ultrasound were also utilized to evaluate blood flow to the testicles.  COMPARISON:  None.  FINDINGS: Right testicle  Measurements: 47 x 19 x 24 mm. No mass or microlithiasis visualized.  Left testicle  Measurements: 40 x 23 x 16 mm. No mass or microlithiasis visualized.  Right epididymis:  Normal in size and appearance.  Left epididymis:  Mildly enlarged and hypervascular  Hydrocele: Tiny right hydrocele. Large complex left hydrocele with multiple septations.  Varicocele:  None visualized.  Pulsed Doppler interrogation of both testes demonstrates increased blood flow to both testicles.  IMPRESSION: Findings consistent with bilateral orchitis and left epididymitis, with large complex hydrocele left scrotum.   Electronically Signed   By: Skipper Cliche M.D.   On: 10/08/2013 17:37    Assessment/Plan Principal Problem:   Sepsis likely due to UTI and possible pyelonephritis, bilateral orchitis with left epididymitis: Patient had a recent diagnosis of prostate cancer with urological surgery in March 2015 - UA positive for UTI, ultrasound  scrotum identified bilateral orchitis and left epididymitis with large complex hydrocele of left scrotum no signs of testicle torsion. CT abdomen and pelvis showed mild ileus, patient had a bowel movement this morning. No nausea or vomiting at this time. - Will continue IV fluid hydration, placed on clears only if patient can tolerate, obtain calcitonin, urine culture, blood cultures - Placed on IV vancomycin and Zosyn, will follow cultures - Urology was consulted by [facility], who spoke with Dr. Risa Grill and will see patient in consultation for further recommendations.   Active Problems:    Scrotal edema with pain: Due to bilateral orchitis and left epididymitis,  intermittent hematuria - Will continue IV vancomycin and Zosyn, further recommendations by urology - Pain control     Ileus: Currently no nausea vomiting, patient had a bowel movement this morning - Will attempt clear liquid diet if he tolerates, continue IV fluid hydration -Placed on bowel regimen to avoid constipation.  History of recent diagnosis of prostate cancer - Follow outpatient with Dr. Valentina Gu at [facility], will follow recommendation here with urology   DVT prophylaxis: SCDs for now due to intermittent hematuria  CODE STATUS: Full code  Family Communication: Admission, patients condition and plan of care including tests being ordered have been discussed with the patient who indicates understanding and agree with the plan and Code Status   Further plan will depend as patient's clinical course evolves and further radiologic and laboratory data become available.   Time Spent on Admission: 1 hour  Ripudeep Krystal Eaton M.D. [facility] 10/08/2013, 7:38 PM Pager: [phone]  If 7PM-7AM, please contact night-coverage [email or web address] Password [email or web address]  **Disclaimer: This note was dictated with voice recognition software. Similar sounding words can inadvertently be transcribed and this note may contain transcription errors which may not have been corrected upon publication of note.**

## 2013-10-08 NOTE — ED Provider Notes (Signed)
Medical screening examination/treatment/procedure(s) were conducted as a shared visit with non-physician practitioner(s) and myself.  I personally evaluated the patient during the encounter.   EKG Interpretation None      Kevon Tench is a 63 y.o. male hx of prostate Ca s/p prostatectomy on 3/18 at [facility] here with groin swelling. Groin swelling started a week after the surgery. Came to ER, CT showed postop fluid. Sent home, swelling improved. Worse swelling since yesterday. No vomiting. Noticed fever today. Febrile, tachycardic here. WBC 20. UA + UTI. US showed bilateral orchitis. CT showed possible seroma and ileus. Given zosyn in the ED. I called Dr. Risa Grill, who doubts true orchitis. Likely postop hematoma. He will follow with hospitalist. Will admit to hospitalist.    Wandra Arthurs, MD 10/08/13 [id number]

## 2013-10-08 NOTE — ED Notes (Signed)
US at bedside

## 2013-10-08 NOTE — ED Provider Notes (Signed)
CSN: [id number]     Arrival date & time 10/08/13  1500 History   First MD Initiated Contact with Patient 10/08/13 1508     Chief Complaint  Patient presents with  . ca pt, post op pain, prostate surgery      (Consider location/radiation/quality/duration/timing/severity/associated sxs/prior Treatment) The history is provided by the patient. No language interpreter was used.  [name] is a 63 y/o M with PMHx of allergic rhinitis, asthma, colonic polyps, OA, eczema presenting to the ED with abdominal swelling, testicular swelling, and penile swelling that started yesterday and has gotten worse. Patient reported that he has pain localized to the lower abdomen, described as "razorblades" with pain worse on the left side. Patient reported that he had an episode of melenic stools approximately 3 days ago. Reported that with urination intermittently he has been having small blood clots and decreased urination with decreased flow with pain. Patient reported that he has been taking oxycodone and ketorolac for pain with minimal relief. Reported that he has been feeling extremely weak over the past couple of days. Reported that he has noticed swelling to his scrotum starting yesterday - reported that the swelling has gotten worse since onset last night. Reported that he has been having pain with apply any clothing or any pressure to the scrotum. Patient reported that he has been having a fever, reported that he took his temperature approximately 101F - here in the ED rectally his temperature is 100.58F. Patient reported that he has been having nausea with one episode of emesis yesterday mainly of yellow stomach contents - NB/NB. Patient diagnosed with prostate cancer on 06/19/2013 and laparoscopic radical prostatectomy with bilateral pelvic lymphadenectomy performed on 09/13/2013 by Dr. Valentina Gu at [facility]. Patient reported that he has an appointment with Dr. Valentina Gu on 10/26/2013. Denied hematochezia, numbness,  syncope, chest pain, shortness of breath, difficulty breathing. PCP Dr. Burnice Logan   Past Medical History  Diagnosis Date  . ALLERGIC RHINITIS 04/19/2009  . ASTHMA 04/19/2009  . COLONIC POLYPS, HX OF 04/19/2009  . OSTEOARTHRITIS 04/19/2009  . Eczema    Past Surgical History  Procedure Laterality Date  . Joint replacement      left hip  . Prostatectomy      September 14, 2013   No family history on file. History  Substance Use Topics  . Smoking status: Former Smoker -- 1.00 packs/day    Quit date: 07/03/2011  . Smokeless tobacco: Never Used  . Alcohol Use: No    Review of Systems  Constitutional: Positive for fever and chills.  Respiratory: Negative for chest tightness and shortness of breath.   Cardiovascular: Negative for chest pain.  Gastrointestinal: Positive for nausea, vomiting, abdominal pain, constipation and blood in stool. Negative for diarrhea and anal bleeding.  Genitourinary: Positive for hematuria, decreased urine volume, penile swelling and scrotal swelling.  Neurological: Positive for dizziness and weakness.  All other systems reviewed and are negative.     Allergies  Horse-derived products  Home Medications   Current Outpatient Rx  Name  Route  Sig  Dispense  Refill  . albuterol (PROAIR HFA) 108 (90 BASE) MCG/ACT inhaler   Inhalation   Inhale 2 puffs into the lungs every 6 (six) hours as needed for wheezing.   17 g   5   . ibuprofen (ADVIL,MOTRIN) 200 MG tablet   Oral   Take 400 mg by mouth every 6 (six) hours as needed for moderate pain.         Marland Kitchen  ibuprofen (ADVIL,MOTRIN) 800 MG tablet   Oral   Take 800 mg by mouth every 6 (six) hours as needed (pain).         . Multiple Vitamin (MULTIVITAMIN WITH MINERALS) TABS tablet   Oral   Take 1 tablet by mouth daily.         . Omega-3 Fatty Acids (FISH OIL) 1000 MG CAPS   Oral   Take 2 capsules by mouth daily.         Marland Kitchen oxyCODONE-acetaminophen (PERCOCET/ROXICET) 5-325 MG per tablet    Oral   Take 1-2 tablets by mouth every 4 (four) hours as needed for severe pain (pain).         Marland Kitchen senna-docusate (SENNA PLUS) 8.6-50 MG per tablet   Oral   Take 1 tablet by mouth daily as needed for mild constipation (constipation).         . triamcinolone cream (KENALOG) 0.1 %   Topical   Apply 1 application topically 2 (two) times daily as needed.         . vitamin C (ASCORBIC ACID) 500 MG tablet   Oral   Take 500 mg by mouth daily.          BP 140/78  Pulse 107  Temp(Src) 100.5 F (38.1 C) (Oral)  Resp 16  Ht 5\' 10"  (1.778 m)  Wt 200 lb (90.719 kg)  BMI 28.70 kg/m2  SpO2 98% Physical Exam  Nursing note and vitals reviewed. Constitutional: He is oriented to person, place, and time. He appears well-developed and well-nourished. No distress.  Patient laying in bed, patient appears uncomfortable   HENT:  Head: Normocephalic and atraumatic.  Mouth/Throat: Oropharynx is clear and moist. No oropharyngeal exudate.  Eyes: Conjunctivae and EOM are normal. Pupils are equal, round, and reactive to light. Right eye exhibits no discharge. Left eye exhibits no discharge.  Neck: Normal range of motion. Neck supple. No tracheal deviation present.  Negative neck stiffness Negative nuchal rigidity Negative cervical lymphadenopathy   Cardiovascular: Normal rate, regular rhythm and normal heart sounds.  Exam reveals no friction rub.   No murmur heard. Pulses:      Radial pulses are 2+ on the right side, and 2+ on the left side.       Dorsalis pedis pulses are 2+ on the right side, and 2+ on the left side.  Pulmonary/Chest: Effort normal and breath sounds normal. No respiratory distress. He has no wheezes. He has no rales.  Patient is able to speak in full sentences without difficulty  Negative use of accessory muscles Negative stridor  Abdominal: Soft. Bowel sounds are normal. He exhibits distension. There is tenderness in the suprapubic area and left lower quadrant. There is  guarding. There is negative Murphy's sign.    Abdominal distension noted - more so on the right than on the left Generalized discomfort upon palpation to the abdomen - more so on the LLQ  Soft upon palpation  Incision sites noted with negative swelling, erythema, inflammation, drainage - negative cellulitic findings  Genitourinary: Guaiac negative stool.  Genital exam: Uncircumcised. Swelling noted to the penis circumferentially with negative erythema, inflammation, lesions, sores noted. Negative active drainage or bleeding noted. Swelling localized to the scrotum, bilaterally - left more so then the right. Left testicle hard upon palpation Negative hydrocele. Negative varicocele. Exquisite tenderness upon palpation to the scrotum.   Musculoskeletal: Normal range of motion.  Lymphadenopathy:    He has no cervical adenopathy.  Neurological: He is alert and  oriented to person, place, and time.  Skin: Skin is warm and dry. No rash noted. He is not diaphoretic. No erythema.  Psychiatric: He has a normal mood and affect. His behavior is normal. Thought content normal.    ED Course  Procedures (including critical care time)  6:17 PM Dr. Allie Bossier put in consult to speak with Urology.   7:04 PM Dr. Darl Householder spoke with Dr. Risa Grill - Urology to consult the patient.   7:19 PM This provider spoke with Dr. Tyler Pita from [facility] - discussed case, history, presentation, labs, imaging in great detail. As per admitting physician recommended patient to be started on Vancomycin.   Results for orders placed during the hospital encounter of 10/08/13  CBC WITH DIFFERENTIAL      Result Value Ref Range   WBC 20.5 (*) 4.0 - 10.5 K/uL   RBC 4.34  4.22 - 5.81 MIL/uL   Hemoglobin 14.2  13.0 - 17.0 g/dL   HCT 42.4  39.0 - 52.0 %   MCV 97.7  78.0 - 100.0 fL   MCH 32.7  26.0 - 34.0 pg   MCHC 33.5  30.0 - 36.0 g/dL   RDW 14.9  11.5 - 15.5 %   Platelets 397  150 - 400 K/uL   Neutrophils Relative % 86 (*) 43 - 77 %    Neutro Abs 17.6 (*) 1.7 - 7.7 K/uL   Lymphocytes Relative 7 (*) 12 - 46 %   Lymphs Abs 1.4  0.7 - 4.0 K/uL   Monocytes Relative 7  3 - 12 %   Monocytes Absolute 1.5 (*) 0.1 - 1.0 K/uL   Eosinophils Relative 0  0 - 5 %   Eosinophils Absolute 0.0  0.0 - 0.7 K/uL   Basophils Relative 0  0 - 1 %   Basophils Absolute 0.0  0.0 - 0.1 K/uL  COMPREHENSIVE METABOLIC PANEL      Result Value Ref Range   Sodium 135 (*) 137 - 147 mEq/L   Potassium 4.6  3.7 - 5.3 mEq/L   Chloride 95 (*) 96 - 112 mEq/L   CO2 27  19 - 32 mEq/L   Glucose, Bld 126 (*) 70 - 99 mg/dL   BUN 12  6 - 23 mg/dL   Creatinine, Ser 1.05  0.50 - 1.35 mg/dL   Calcium 9.7  8.4 - 10.5 mg/dL   Total Protein 8.8 (*) 6.0 - 8.3 g/dL   Albumin 3.5  3.5 - 5.2 g/dL   AST 41 (*) 0 - 37 U/L   ALT 43  0 - 53 U/L   Alkaline Phosphatase 99  39 - 117 U/L   Total Bilirubin 0.7  0.3 - 1.2 mg/dL   GFR calc non Af Amer 74 (*) >90 mL/min   GFR calc Af Amer 85 (*) >90 mL/min  URINALYSIS, ROUTINE W REFLEX MICROSCOPIC      Result Value Ref Range   Color, Urine AMBER (*) YELLOW   APPearance CLOUDY (*) CLEAR   Specific Gravity, Urine 1.028  1.005 - 1.030   pH 8.5 (*) 5.0 - 8.0   Glucose, UA NEGATIVE  NEGATIVE mg/dL   Hgb urine dipstick LARGE (*) NEGATIVE   Bilirubin Urine NEGATIVE  NEGATIVE   Ketones, ur NEGATIVE  NEGATIVE mg/dL   Protein, ur 100 (*) NEGATIVE mg/dL   Urobilinogen, UA 0.2  0.0 - 1.0 mg/dL   Nitrite POSITIVE (*) NEGATIVE   Leukocytes, UA LARGE (*) NEGATIVE  URINE MICROSCOPIC-ADD ON  Result Value Ref Range   WBC, UA 21-50  <3 WBC/hpf   RBC / HPF 11-20  <3 RBC/hpf   Bacteria, UA MANY (*) RARE  I-STAT CG4 LACTIC ACID, ED      Result Value Ref Range   Lactic Acid, Venous 1.52  0.5 - 2.2 mmol/L  POC OCCULT BLOOD, ED      Result Value Ref Range   Fecal Occult Bld NEGATIVE  NEGATIVE     Labs Review Labs Reviewed  CBC WITH DIFFERENTIAL - Abnormal; Notable for the following:    WBC 20.5 (*)    Neutrophils Relative % 86  (*)    Neutro Abs 17.6 (*)    Lymphocytes Relative 7 (*)    Monocytes Absolute 1.5 (*)    All other components within normal limits  COMPREHENSIVE METABOLIC PANEL - Abnormal; Notable for the following:    Sodium 135 (*)    Chloride 95 (*)    Glucose, Bld 126 (*)    Total Protein 8.8 (*)    AST 41 (*)    GFR calc non Af Amer 74 (*)    GFR calc Af Amer 85 (*)    All other components within normal limits  URINALYSIS, ROUTINE W REFLEX MICROSCOPIC - Abnormal; Notable for the following:    Color, Urine AMBER (*)    APPearance CLOUDY (*)    pH 8.5 (*)    Hgb urine dipstick LARGE (*)    Protein, ur 100 (*)    Nitrite POSITIVE (*)    Leukocytes, UA LARGE (*)    All other components within normal limits  URINE MICROSCOPIC-ADD ON - Abnormal; Notable for the following:    Bacteria, UA MANY (*)    All other components within normal limits  CULTURE, BLOOD (ROUTINE X 2)  CULTURE, BLOOD (ROUTINE X 2)  URINE CULTURE  OCCULT BLOOD X 1 CARD TO LAB, STOOL  PROCALCITONIN  I-STAT CG4 LACTIC ACID, ED  POC OCCULT BLOOD, ED   Imaging Review Dg Chest 2 View  10/08/2013   CLINICAL DATA:  Shortness of breath and pain with history of asthma and prostate malignancy  EXAM: CHEST  2 VIEW  COMPARISON:  DG CHEST 1V PORT dated 10/05/2006  FINDINGS: The lungs are adequately inflated. There is no focal pneumonia. Subtle nodularity projects in the region of the nipples bilaterally lying at the level of the posterior eighth ribs bilaterally. The cardiac silhouette is normal in size. The pulmonary vascularity is not engorged. The mediastinum is normal in width. There is no pleural effusion. There is mild tortuosity of the descending thoracic aorta. The observed portions of the bony thorax exhibit no acute abnormalities.  IMPRESSION: There findings which likely reflect known reactive airway disease or COPD. There is no evidence of pneumonia nor CHF or other acute cardiopulmonary abnormality.   Electronically Signed   By:  David  Martinique   On: 10/08/2013 17:52   Dg Pelvis 1-2 Views  10/08/2013   CLINICAL DATA:  Groin swelling ; history of prostate malignancy.  EXAM: PELVIS - 1-2 VIEW  COMPARISON:  None.  FINDINGS: The bony pelvis appears adequately mineralized. There is no lytic nor blastic lesion or evidence of an acute fracture. 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 There are degenerative changes of the right hip joint. There is contrast in the nondistended urinary bladder. There may be mass effect upon the lateral aspects of the urinary bladder. There are degenerative changes of the lower lumbar spine.  IMPRESSION: There  is no acute bony abnormality of the pelvis.   Electronically Signed   By: David  Martinique   On: 10/08/2013 17:59   US Scrotum  10/08/2013   CLINICAL DATA:  Bilateral testicles swelling, rule out torsion  EXAM: SCROTAL ULTRASOUND  DOPPLER ULTRASOUND OF THE TESTICLES  TECHNIQUE: Complete ultrasound examination of the testicles, epididymis, and other scrotal structures was performed. Color and spectral Doppler ultrasound were also utilized to evaluate blood flow to the testicles.  COMPARISON:  None.  FINDINGS: Right testicle  Measurements: 47 x 19 x 24 mm. No mass or microlithiasis visualized.  Left testicle  Measurements: 40 x 23 x 16 mm. No mass or microlithiasis visualized.  Right epididymis:  Normal in size and appearance.  Left epididymis:  Mildly enlarged and hypervascular  Hydrocele: Tiny right hydrocele. Large complex left hydrocele with multiple septations.  Varicocele:  None visualized.  Pulsed Doppler interrogation of both testes demonstrates increased blood flow to both testicles.  IMPRESSION: Findings consistent with bilateral orchitis and left epididymitis, with large complex hydrocele left scrotum.   Electronically Signed   By: Skipper Cliche M.D.   On: 10/08/2013 17:37   Ct Abdomen Pelvis W Contrast  10/08/2013   CLINICAL DATA:  Increasing left-sided abdominal pain with vomiting and intermittent  hematuria. Recent prostatectomy with subsequent penile swelling.  EXAM: CT ABDOMEN AND PELVIS WITH CONTRAST  TECHNIQUE: Multidetector CT imaging of the abdomen and pelvis was performed using the standard protocol following bolus administration of intravenous contrast.  CONTRAST:  117mL OMNIPAQUE IOHEXOL 300 MG/ML SOLN, 77mL OMNIPAQUE IOHEXOL 300 MG/ML SOLN  COMPARISON:  US SCROTUM dated 10/08/2013; CT PELVIS W/CM dated 09/16/2013; CT ABD W/CM dated 02/10/2007  FINDINGS: There is stable mild atelectasis or scarring in both lung bases. No significant pleural or pericardial effusion is present. There is a small hiatal hernia.  Hepatic steatosis appears mildly improved. There is no focal hepatic abnormality. The spleen, gallbladder, pancreas, adrenal glands and kidneys appear normal. There is no hydronephrosis.  The stomach and small bowel appear normal without significant distention or wall thickening. The colon is diffusely fluid filled, but shows no wall thickening or surrounding inflammation. The appendix appears normal.  Foley catheter has been removed. There is bladder wall thickening with mild residual perivesical soft tissue stranding. 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 Fluid collections posterior to both external iliac arteries also appear slightly smaller and better defined, measuring 3.3 x 2.1 cm on the right and 3.4 x 1.6 cm on the left. There is minimal residual air within the right-sided collection. There is no evidence of iliac vein DVT. Edematous changes within the perineum and upper scrotum appear improved compared with the recent study.  IMPRESSION: 1. Evolving postoperative pelvic fluid collections, likely seromas or lymphoceles. The perineal and perivesical soft tissue stranding noted previously have improved. 2. Fluid-filled colon, likely  ileus. No evidence of bowel obstruction or new extraluminal fluid collection. 3. No acute abdominal findings.  Mild hepatic steatosis.   Electronically Signed   By: Camie Patience M.D.   On: 10/08/2013 18:09   [place] Art/ven Flow Abd Pelv Doppler  10/08/2013   CLINICAL DATA:  Bilateral testicles swelling, rule out torsion  EXAM: SCROTAL ULTRASOUND  DOPPLER ULTRASOUND OF THE TESTICLES  TECHNIQUE: Complete ultrasound examination of the testicles, epididymis, and other scrotal structures was performed. Color and spectral Doppler ultrasound were also  utilized to evaluate blood flow to the testicles.  COMPARISON:  None.  FINDINGS: Right testicle  Measurements: 47 x 19 x 24 mm. No mass or microlithiasis visualized.  Left testicle  Measurements: 40 x 23 x 16 mm. No mass or microlithiasis visualized.  Right epididymis:  Normal in size and appearance.  Left epididymis:  Mildly enlarged and hypervascular  Hydrocele: Tiny right hydrocele. Large complex left hydrocele with multiple septations.  Varicocele:  None visualized.  Pulsed Doppler interrogation of both testes demonstrates increased blood flow to both testicles.  IMPRESSION: Findings consistent with bilateral orchitis and left epididymitis, with large complex hydrocele left scrotum.   Electronically Signed   By: Skipper Cliche M.D.   On: 10/08/2013 17:37     EKG Interpretation None      MDM   Final diagnoses:  Sepsis  Ileus  Orchitis  Epididymitis, left  S/P prostatectomy  Pyelonephritis   Medications  0.9 %  sodium chloride infusion ( Intravenous Stopped 10/08/13 1814)  sodium chloride 0.9 % bolus 1,000 mL (1,000 mLs Intravenous Rate/Dose Change 10/08/13 1653)  vancomycin (VANCOCIN) 2,000 mg in sodium chloride 0.9 % 500 mL IVPB (not administered)  vancomycin (VANCOCIN) 1,250 mg in sodium chloride 0.9 % 250 mL IVPB (not administered)  morphine 4 MG/ML injection 4 mg (4 mg Intravenous Given 10/08/13 1627)  ondansetron (ZOFRAN) injection 4 mg (4 mg  Intravenous Given 10/08/13 1627)  iohexol (OMNIPAQUE) 300 MG/ML solution 100 mL (100 mLs Intravenous Contrast Given 10/08/13 1720)  iohexol (OMNIPAQUE) 300 MG/ML solution 50 mL (50 mLs Oral Contrast Given 10/08/13 1630)  acetaminophen (TYLENOL) tablet 1,000 mg (1,000 mg Oral Given 10/08/13 1806)  HYDROmorphone (DILAUDID) injection 1 mg (1 mg Intravenous Given 10/08/13 1806)  sodium chloride 0.9 % bolus 1,000 mL (1,000 mLs Intravenous New Bag/Given 10/08/13 1825)  piperacillin-tazobactam (ZOSYN) IVPB 3.375 g (0 g Intravenous Stopped 10/08/13 1916)   Filed Vitals:   10/08/13 1507 10/08/13 1519 10/08/13 1802 10/08/13 1929  BP: 162/96  140/78   Pulse: 125 123 107   Temp: 99.3 F (37.4 C) 100.2 F (37.9 C) 100.5 F (38.1 C)   TempSrc: Oral Rectal Oral   Resp: 16  16   Height:    5\' 10"  (1.778 m)  Weight:    200 lb (90.719 kg)  SpO2: 97%  98%     Patient presenting to the ED with abdominal pain/swelling, scrotal pain/swelling, nausea, vomiting, melena, fever that has been ongoing, but worse since yesterday. Patient reported that the scrotal swelling has gotten worse since last night. Patient reported that he has been taking oxycodone for the pain with minimal relief. Reported that he felt weak and took his temperature today which was 101F orally. Stated that he has been having decreased urination and blood clots in the urine intermittently. This provider reviewed the patient's chart. Patient diagnosed with prostate cancer with gleason score 7 PSA of 8 stage T1c on 06/19/2013. Patient recently had laparoscopic radical prostatectomy with bilateral pelvic lymphadenectomy performed on 09/13/2013 at [facility] and discharged on 09/14/2013. Patient was seen and assessed in the ED setting on 09/16/2013 regarding abdominal and penile swelling that was negative for acute abnormalities - CT scan noted swelling to the scrotal region that appeared to be due to post-surgery. Patient was seen and assessed by Dr. Valentina Gu in  office on 09/26/2013 where catheter was removed and patient was able to void more than 150 mL by himself. Alert and oriented. GCS 15. Patient found laying in bed, appears  uncomfortable. Heart rate and rhythm normal. Lungs good auscultation to upper lower lobes bilaterally. Radial and DP pulses 2+ bilaterally. Abdominal distention identified with more swelling identified to the right side of the abdomen and the left side. Laparoscopic incisions identified, 6 presents with negative inflammation or signs of cellulitic infection noted - sites healing well. Discomfort upon palpation to the entire abdomen with most discomfort upon palpation to the lower quadrants, left more so than right. Penile swelling identified circumferentially with negative signs of infection. Scrotal swelling identified left testicle appears to feel hard with exquisite pain upon palpation. Negative inguinal lymphadenopathy noted. Rectal exam unremarkable-negative blood on glove, brown stools on glove. Upon arrival to the ED patient's heart rate was 125 beats per minute with an oral temperature of 99.43F-rectally patient's temperature is 100F. CBC noted elevation white blood cell count of 20.5 with neutrophils elevated at 17.6, monocytes elevated 1.5 and neutrophils elevated 86. Negative drop in hemoglobin-hemoglobin 14.2, hematocrit 42.4. CMP noted mildly low sodium of 135, chloride of 95. Lactic acid negative elevation. Bladder scan noted urine of more than 200 mL. Fecal occult negative. UA noted positive nitrites and large leukocytes with WBC of 21-50 and many bacteria noted. Pelvic plain film negative fluid accumulation, negative free air noted - negative acute osseous injury. Chest xray noted airway disease or COPD with no evidence of pneumonia nor CHF or other acute cardiopulmonary disease. US scrotum identified findings consistent with bilateral orchitis and left epididymitis, with large complex hydrocele of left scrotum. Negative signs of  testicular torsion. CT abdomen and pelvis with contrast noted pelvis fluid collections suspicion for seromas or lymphoceles. Fluid filled colon likely to be an ileus - no evidence of bowel obstruction or new extraluminal fluid collection. No acute abdominal findings. Patient started on IV fluids, Tylenol given, and started on zosyn. Patient's pain controlled in ED setting. Urology was consulted and urology to consult patient while in the hospital. Patient to be admitted to the hospital for sepsis, ileus, pyelonephritis, orchitis, epididymitis. Patient started on Vancomycin as well for broad spectrum as per request by admitting physician. Patient to be admitted - patient understood and agreed. Patient placed as inpatient to [facility].   Jamse Mead, PA-C 10/08/13 2036

## 2013-10-09 ENCOUNTER — Other Ambulatory Visit: Payer: 59

## 2013-10-09 ENCOUNTER — Encounter (HOSPITAL_COMMUNITY): Payer: Self-pay | Admitting: *Deleted

## 2013-10-09 DIAGNOSIS — J309 Allergic rhinitis, unspecified: Secondary | ICD-10-CM

## 2013-10-09 LAB — CBC
HCT: 38.6 % — ABNORMAL LOW (ref 39.0–52.0)
HEMOGLOBIN: 12.4 g/dL — AB (ref 13.0–17.0)
MCH: 32 pg (ref 26.0–34.0)
MCHC: 32.1 g/dL (ref 30.0–36.0)
MCV: 99.5 fL (ref 78.0–100.0)
Platelets: 313 10*3/uL (ref 150–400)
RBC: 3.88 MIL/uL — AB (ref 4.22–5.81)
RDW: 15.2 % (ref 11.5–15.5)
WBC: 18.5 10*3/uL — ABNORMAL HIGH (ref 4.0–10.5)

## 2013-10-09 LAB — BASIC METABOLIC PANEL
BUN: 11 mg/dL (ref 6–23)
CO2: 26 mEq/L (ref 19–32)
Calcium: 8.7 mg/dL (ref 8.4–10.5)
Chloride: 101 mEq/L (ref 96–112)
Creatinine, Ser: 0.96 mg/dL (ref 0.50–1.35)
GFR, EST NON AFRICAN AMERICAN: 86 mL/min — AB (ref >90)
Glucose, Bld: 102 mg/dL — ABNORMAL HIGH (ref 70–99)
POTASSIUM: 4.3 meq/L (ref 3.7–5.3)
SODIUM: 137 meq/L (ref 137–147)

## 2013-10-09 MED ORDER — DIPHENHYDRAMINE HCL 25 MG PO CAPS
25.0000 mg | ORAL_CAPSULE | Freq: Four times a day (QID) | ORAL | Status: DC | PRN
  Administered 2013-10-09 – 2013-10-10 (×6): 25 mg via ORAL
  Filled 2013-10-09 (×6): qty 1

## 2013-10-09 MED ORDER — HYDROXYZINE HCL 10 MG PO TABS
10.0000 mg | ORAL_TABLET | Freq: Three times a day (TID) | ORAL | Status: DC | PRN
  Administered 2013-10-09 – 2013-10-10 (×3): 10 mg via ORAL
  Filled 2013-10-09 (×3): qty 1

## 2013-10-09 NOTE — Consult Note (Signed)
Urology Consult   Physician requesting consult: Ardyth Harps  Reason for consult: orchitis/left epididymitis  History of Present Illness: [name] is a 63 y.o. male patient w/ GU hx of prostate cancer diagnosed in Dec 2014 w/ baseline PSA of 5.9, pt does not know Gleason score . He underwent RALP (robotic assisted laparascopic prostatectomy) bilateral pelvic lymphadenectomy, nerve sparing, by Dr. Creed Copper at [facility] on September 13, 2013.  He was seen at [facility] ER 09/16/13 for abdominal bloating and scrotal edema /swelling one week post op and Foley catheter removal was delayed until the following week. Percocet and ibuprofen provided relief Scrotal swelling improved for several days after catheter removal but returned 3 days ago w/ worsening pain associated w/ 101F at home, nausea/vomiting.  In the ER CBC showed white count of 20.5, lactic acid 1.5, sodium 135, BUN 12, creatinine 1.05 with 100.9F. CT abdomen and pelvis showed evolving postoperative pelvic fluid collections most likely post op.  He is currently c/o scrotal pain 3/10 in intensity and improved w/ Percocet.  He also had some constipation and was taking laxatives.  He is incontinent post op but only uses a small liner during the day.  He denies a history of voiding or storage urinary symptoms, hematuria, UTIs, STDs, urolithiasis, GU malignancy/trauma.  Denies any other associated signs/symptoms.    Past Medical History  Diagnosis Date  . ALLERGIC RHINITIS 04/19/2009  . ASTHMA 04/19/2009  . COLONIC POLYPS, HX OF 04/19/2009  . OSTEOARTHRITIS 04/19/2009  . Eczema     Past Surgical History  Procedure Laterality Date  . Joint replacement      left hip  . Prostatectomy      September 14, 2013    Current Hospital Medications:  Home Meds:    Medication List    ASK your doctor about these medications       albuterol 108 (90 BASE) MCG/ACT inhaler  Commonly known as:  PROAIR HFA  Inhale 2 puffs into the lungs every 6 (six) hours  as needed for wheezing.     Fish Oil 1000 MG Caps  Take 2 capsules by mouth daily.     ibuprofen 800 MG tablet  Commonly known as:  ADVIL,MOTRIN  Take 800 mg by mouth every 6 (six) hours as needed (pain).     ibuprofen 200 MG tablet  Commonly known as:  ADVIL,MOTRIN  Take 400 mg by mouth every 6 (six) hours as needed for moderate pain.     multivitamin with minerals Tabs tablet  Take 1 tablet by mouth daily.     oxyCODONE-acetaminophen 5-325 MG per tablet  Commonly known as:  PERCOCET/ROXICET  Take 1-2 tablets by mouth every 4 (four) hours as needed for severe pain (pain).     SENNA PLUS 8.6-50 MG per tablet  Generic drug:  senna-docusate  Take 1 tablet by mouth daily as needed for mild constipation (constipation).     triamcinolone cream 0.1 %  Commonly known as:  KENALOG  Apply 1 application topically 2 (two) times daily as needed.     vitamin C 500 MG tablet  Commonly known as:  ASCORBIC ACID  Take 500 mg by mouth daily.        Scheduled Meds: . docusate sodium  100 mg Oral BID  .  morphine injection  4 mg Intravenous Once  . multivitamin with minerals  1 tablet Oral Daily  . piperacillin-tazobactam (ZOSYN)  IV  3.375 g Intravenous Q8H  . sodium chloride  1,000 mL Intravenous  Once  . vancomycin  1,250 mg Intravenous Q12H  . vitamin C  500 mg Oral Daily   Continuous Infusions: . sodium chloride 100 mL/hr at 10/08/13 2259   PRN Meds:.acetaminophen, acetaminophen, albuterol, alum & mag hydroxide-simeth, bisacodyl, diphenhydrAMINE, HYDROcodone-acetaminophen, HYDROmorphone (DILAUDID) injection, hydrOXYzine, ondansetron (ZOFRAN) IV, ondansetron, senna-docusate  Allergies:  Allergies  Allergen Reactions  . Horse-Derived Products Anaphylaxis    History reviewed. No pertinent family history.  Social History:  reports that he quit smoking about 2 years ago. He has never used smokeless tobacco. He reports that he does not drink alcohol or use illicit drugs.  ROS: A  complete review of systems was performed.   Foley catheter was removed at [facility] last week and patient has been experiencing some leakage as expected post op but feels that he empties his bladder well.  Denies hematuria, dysuria, nausea/.   All systems are negative except for pertinent findings as noted above.  Physical Exam:  Vital signs in last 24 hours: Temp:  [98 F (36.7 C)-100.5 F (38.1 C)] 98 F (36.7 C) (04/13 0400) Pulse Rate:  [81-125] 86 (04/13 1000) Resp:  [11-24] 22 (04/13 1200) BP: (108-169)/(77-101) 169/98 mmHg (04/13 1200) SpO2:  [78 %-99 %] 99 % (04/13 1000) Weight:  [90.719 kg (200 lb)] 90.719 kg (200 lb) (04/12 1929) Constitutional:  Alert and oriented, No acute distress Cardiovascular: Regular rate  Respiratory: Normal respiratory effort GI: Abdomen is soft, nontender, nondistended, no abdominal masses Abdomen:  Laparoscopic incision sites on abdomen area are completely healed and scabbing of granulation tissue is present  GU: No CVA tenderness. Bilateral scrotal swelling.  Left scrotum is more enlarged, edematous, and very tender to touch.  No fluctuant, no induration, no discharge, or mass.  Lymphatic: No lymphadenopathy Neurologic: Grossly intact, no focal deficits Psychiatric: Normal mood and affect  Laboratory Data:   Recent Labs  10/08/13 1530 10/09/13 0320  WBC 20.5* 18.5*  HGB 14.2 12.4*  HCT 42.4 38.6*  PLT 397 313     Recent Labs  10/08/13 1530 10/09/13 0320  NA 135* 137  K 4.6 4.3  CL 95* 101  GLUCOSE 126* 102*  BUN 12 11  CALCIUM 9.7 8.7  CREATININE 1.05 0.96     Results for orders placed during the hospital encounter of 10/08/13 (from the past 24 hour(s))  CBC WITH DIFFERENTIAL     Status: Abnormal   Collection Time    10/08/13  3:30 PM      Result Value Ref Range   WBC 20.5 (*) 4.0 - 10.5 K/uL   RBC 4.34  4.22 - 5.81 MIL/uL   Hemoglobin 14.2  13.0 - 17.0 g/dL   HCT 42.4  39.0 - 52.0 %   MCV 97.7  78.0 - 100.0 fL    MCH 32.7  26.0 - 34.0 pg   MCHC 33.5  30.0 - 36.0 g/dL   RDW 14.9  11.5 - 15.5 %   Platelets 397  150 - 400 K/uL   Neutrophils Relative % 86 (*) 43 - 77 %   Neutro Abs 17.6 (*) 1.7 - 7.7 K/uL   Lymphocytes Relative 7 (*) 12 - 46 %   Lymphs Abs 1.4  0.7 - 4.0 K/uL   Monocytes Relative 7  3 - 12 %   Monocytes Absolute 1.5 (*) 0.1 - 1.0 K/uL   Eosinophils Relative 0  0 - 5 %   Eosinophils Absolute 0.0  0.0 - 0.7 K/uL   Basophils Relative 0  0 -  1 %   Basophils Absolute 0.0  0.0 - 0.1 K/uL  COMPREHENSIVE METABOLIC PANEL     Status: Abnormal   Collection Time    10/08/13  3:30 PM      Result Value Ref Range   Sodium 135 (*) 137 - 147 mEq/L   Potassium 4.6  3.7 - 5.3 mEq/L   Chloride 95 (*) 96 - 112 mEq/L   CO2 27  19 - 32 mEq/L   Glucose, Bld 126 (*) 70 - 99 mg/dL   BUN 12  6 - 23 mg/dL   Creatinine, Ser 1.05  0.50 - 1.35 mg/dL   Calcium 9.7  8.4 - 10.5 mg/dL   Total Protein 8.8 (*) 6.0 - 8.3 g/dL   Albumin 3.5  3.5 - 5.2 g/dL   AST 41 (*) 0 - 37 U/L   ALT 43  0 - 53 U/L   Alkaline Phosphatase 99  39 - 117 U/L   Total Bilirubin 0.7  0.3 - 1.2 mg/dL   GFR calc non Af Amer 74 (*) >90 mL/min   GFR calc Af Amer 85 (*) >90 mL/min  PROCALCITONIN     Status: None   Collection Time    10/08/13  3:30 PM      Result Value Ref Range   Procalcitonin 0.23    I-STAT CG4 LACTIC ACID, ED     Status: None   Collection Time    10/08/13  3:39 PM      Result Value Ref Range   Lactic Acid, Venous 1.52  0.5 - 2.2 mmol/L  CULTURE, BLOOD (ROUTINE X 2)     Status: None   Collection Time    10/08/13  3:50 PM      Result Value Ref Range   Specimen Description BLOOD LEFT HAND  2 ML IN [facility] BOTTLE     Special Requests Immunocompromised     Culture  Setup Time       Value: 10/08/2013 20:56     Performed at [facility]   Culture       Value:        BLOOD CULTURE RECEIVED NO GROWTH TO DATE CULTURE WILL BE HELD FOR 5 DAYS BEFORE ISSUING A FINAL NEGATIVE REPORT     Performed at [facility]   Report Status PENDING    CULTURE, BLOOD (ROUTINE X 2)     Status: None   Collection Time    10/08/13  3:55 PM      Result Value Ref Range   Specimen Description BLOOD LEFT ANTECUBITAL     Special Requests BOTTLES DRAWN AEROBIC AND ANAEROBIC 5CC     Culture  Setup Time       Value: 10/08/2013 20:56     Performed at [facility]   Culture       Value:        BLOOD CULTURE RECEIVED NO GROWTH TO DATE CULTURE WILL BE HELD FOR 5 DAYS BEFORE ISSUING A FINAL NEGATIVE REPORT     Performed at [facility]   Report Status PENDING    POC OCCULT BLOOD, ED     Status: None   Collection Time    10/08/13  4:17 PM      Result Value Ref Range   Fecal Occult Bld NEGATIVE  NEGATIVE  URINALYSIS, ROUTINE W REFLEX MICROSCOPIC     Status: Abnormal   Collection Time    10/08/13  6:02 PM  Result Value Ref Range   Color, Urine AMBER (*) YELLOW   APPearance CLOUDY (*) CLEAR   Specific Gravity, Urine 1.028  1.005 - 1.030   pH 8.5 (*) 5.0 - 8.0   Glucose, UA NEGATIVE  NEGATIVE mg/dL   Hgb urine dipstick LARGE (*) NEGATIVE   Bilirubin Urine NEGATIVE  NEGATIVE   Ketones, ur NEGATIVE  NEGATIVE mg/dL   Protein, ur 100 (*) NEGATIVE mg/dL   Urobilinogen, UA 0.2  0.0 - 1.0 mg/dL   Nitrite POSITIVE (*) NEGATIVE   Leukocytes, UA LARGE (*) NEGATIVE  URINE MICROSCOPIC-ADD ON     Status: Abnormal   Collection Time    10/08/13  6:02 PM      Result Value Ref Range   WBC, UA 21-50  <3 WBC/hpf   RBC / HPF 11-20  <3 RBC/hpf   Bacteria, UA MANY (*) RARE  MRSA PCR SCREENING     Status: None   Collection Time    10/08/13  9:03 PM      Result Value Ref Range   MRSA by PCR NEGATIVE  NEGATIVE  BASIC METABOLIC PANEL     Status: Abnormal   Collection Time    10/09/13  3:20 AM      Result Value Ref Range   Sodium 137  137 - 147 mEq/L   Potassium 4.3  3.7 - 5.3 mEq/L   Chloride 101  96 - 112 mEq/L   CO2 26  19 - 32 mEq/L   Glucose, Bld 102 (*) 70 - 99 mg/dL   BUN 11  6 - 23 mg/dL    Creatinine, Ser 0.96  0.50 - 1.35 mg/dL   Calcium 8.7  8.4 - 10.5 mg/dL   GFR calc non Af Amer 86 (*) >90 mL/min   GFR calc Af Amer >90  >90 mL/min  CBC     Status: Abnormal   Collection Time    10/09/13  3:20 AM      Result Value Ref Range   WBC 18.5 (*) 4.0 - 10.5 K/uL   RBC 3.88 (*) 4.22 - 5.81 MIL/uL   Hemoglobin 12.4 (*) 13.0 - 17.0 g/dL   HCT 38.6 (*) 39.0 - 52.0 %   MCV 99.5  78.0 - 100.0 fL   MCH 32.0  26.0 - 34.0 pg   MCHC 32.1  30.0 - 36.0 g/dL   RDW 15.2  11.5 - 15.5 %   Platelets 313  150 - 400 K/uL   Recent Results (from the past 240 hour(s))  CULTURE, BLOOD (ROUTINE X 2)     Status: None   Collection Time    10/08/13  3:50 PM      Result Value Ref Range Status   Specimen Description BLOOD LEFT HAND  2 ML IN [facility] BOTTLE   Final   Special Requests Immunocompromised   Final   Culture  Setup Time     Final   Value: 10/08/2013 20:56     Performed at [facility]   Culture     Final   Value:        BLOOD CULTURE RECEIVED NO GROWTH TO DATE CULTURE WILL BE HELD FOR 5 DAYS BEFORE ISSUING A FINAL NEGATIVE REPORT     Performed at [facility]   Report Status PENDING   Incomplete  CULTURE, BLOOD (ROUTINE X 2)     Status: None   Collection Time    10/08/13  3:55 PM      Result Value  Ref Range Status   Specimen Description BLOOD LEFT ANTECUBITAL   Final   Special Requests BOTTLES DRAWN AEROBIC AND ANAEROBIC 5CC   Final   Culture  Setup Time     Final   Value: 10/08/2013 20:56     Performed at [facility]   Culture     Final   Value:        BLOOD CULTURE RECEIVED NO GROWTH TO DATE CULTURE WILL BE HELD FOR 5 DAYS BEFORE ISSUING A FINAL NEGATIVE REPORT     Performed at [facility]   Report Status PENDING   Incomplete  MRSA PCR SCREENING     Status: None   Collection Time    10/08/13  9:03 PM      Result Value Ref Range Status   MRSA by PCR NEGATIVE  NEGATIVE Final   Comment:            The GeneXpert MRSA Assay (FDA     approved for  NASAL specimens     only), is one component of a     comprehensive MRSA colonization     surveillance program. It is not     intended to diagnose MRSA     infection nor to guide or     monitor treatment for     MRSA infections.    Renal Function:  Recent Labs  10/08/13 1530 10/09/13 0320  CREATININE 1.05 0.96   Estimated Creatinine Clearance: 89.2 ml/min (by C-G formula based on Cr of 0.96).  Radiologic Imaging: Dg Chest 2 View  10/08/2013   CLINICAL DATA:  Shortness of breath and pain with history of asthma and prostate malignancy  EXAM: CHEST  2 VIEW  COMPARISON:  DG CHEST 1V PORT dated 10/05/2006  FINDINGS: The lungs are adequately inflated. There is no focal pneumonia. Subtle nodularity projects in the region of the nipples bilaterally lying at the level of the posterior eighth ribs bilaterally. The cardiac silhouette is normal in size. The pulmonary vascularity is not engorged. The mediastinum is normal in width. There is no pleural effusion. There is mild tortuosity of the descending thoracic aorta. The observed portions of the bony thorax exhibit no acute abnormalities.  IMPRESSION: There findings which likely reflect known reactive airway disease or COPD. There is no evidence of pneumonia nor CHF or other acute cardiopulmonary abnormality.   Electronically Signed   By: David  Martinique   On: 10/08/2013 17:52   Dg Pelvis 1-2 Views  10/08/2013   CLINICAL DATA:  Groin swelling ; history of prostate malignancy.  EXAM: PELVIS - 1-2 VIEW  COMPARISON:  None.  FINDINGS: The bony pelvis appears adequately mineralized. There is no lytic nor blastic lesion or evidence of an acute fracture. 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 There are degenerative changes of the right hip joint. There is contrast in the nondistended urinary bladder. There may be mass effect upon the lateral aspects of the urinary bladder. There are degenerative changes of the lower lumbar spine.  IMPRESSION: There is no acute bony  abnormality of the pelvis.   Electronically Signed   By: David  Martinique   On: 10/08/2013 17:59   US Scrotum  10/08/2013   CLINICAL DATA:  Bilateral testicles swelling, rule out torsion  EXAM: SCROTAL ULTRASOUND  DOPPLER ULTRASOUND OF THE TESTICLES  TECHNIQUE: Complete ultrasound examination of the testicles, epididymis, and other scrotal structures was performed. Color and spectral Doppler ultrasound were also utilized to evaluate blood flow to the  testicles.  COMPARISON:  None.  FINDINGS: Right testicle  Measurements: 47 x 19 x 24 mm. No mass or microlithiasis visualized.  Left testicle  Measurements: 40 x 23 x 16 mm. No mass or microlithiasis visualized.  Right epididymis:  Normal in size and appearance.  Left epididymis:  Mildly enlarged and hypervascular  Hydrocele: Tiny right hydrocele. Large complex left hydrocele with multiple septations.  Varicocele:  None visualized.  Pulsed Doppler interrogation of both testes demonstrates increased blood flow to both testicles.  IMPRESSION: Findings consistent with bilateral orchitis and left epididymitis, with large complex hydrocele left scrotum.   Electronically Signed   By: Skipper Cliche M.D.   On: 10/08/2013 17:37   Ct Abdomen Pelvis W Contrast  10/08/2013   CLINICAL DATA:  Increasing left-sided abdominal pain with vomiting and intermittent hematuria. Recent prostatectomy with subsequent penile swelling.  EXAM: CT ABDOMEN AND PELVIS WITH CONTRAST  TECHNIQUE: Multidetector CT imaging of the abdomen and pelvis was performed using the standard protocol following bolus administration of intravenous contrast.  CONTRAST:  124mL OMNIPAQUE IOHEXOL 300 MG/ML SOLN, 26mL OMNIPAQUE IOHEXOL 300 MG/ML SOLN  COMPARISON:  US SCROTUM dated 10/08/2013; CT PELVIS W/CM dated 09/16/2013; CT ABD W/CM dated 02/10/2007  FINDINGS: There is stable mild atelectasis or scarring in both lung bases. No significant pleural or pericardial effusion is present. There is a small hiatal hernia.   Hepatic steatosis appears mildly improved. There is no focal hepatic abnormality. The spleen, gallbladder, pancreas, adrenal glands and kidneys appear normal. There is no hydronephrosis.  The stomach and small bowel appear normal without significant distention or wall thickening. The colon is diffusely fluid filled, but shows no wall thickening or surrounding inflammation. The appendix appears normal.  Foley catheter has been removed. There is bladder wall thickening with mild residual perivesical soft tissue stranding. 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 Fluid collections posterior to both external iliac arteries also appear slightly smaller and better defined, measuring 3.3 x 2.1 cm on the right and 3.4 x 1.6 cm on the left. There is minimal residual air within the right-sided collection. There is no evidence of iliac vein DVT. Edematous changes within the perineum and upper scrotum appear improved compared with the recent study.  IMPRESSION: 1. Evolving postoperative pelvic fluid collections, likely seromas or lymphoceles. The perineal and perivesical soft tissue stranding noted previously have improved. 2. Fluid-filled colon, likely ileus. No evidence of bowel obstruction or new extraluminal fluid collection. 3. No acute abdominal findings.  Mild hepatic steatosis.   Electronically Signed   By: Camie Patience M.D.   On: 10/08/2013 18:09   [place] Art/ven Flow Abd Pelv Doppler  10/08/2013   CLINICAL DATA:  Bilateral testicles swelling, rule out torsion  EXAM: SCROTAL ULTRASOUND  DOPPLER ULTRASOUND OF THE TESTICLES  TECHNIQUE: Complete ultrasound examination of the testicles, epididymis, and other scrotal structures was performed. Color and spectral Doppler ultrasound were also utilized to evaluate blood flow to the testicles.  COMPARISON:  None.  FINDINGS:  Right testicle  Measurements: 47 x 19 x 24 mm. No mass or microlithiasis visualized.  Left testicle  Measurements: 40 x 23 x 16 mm. No mass or microlithiasis visualized.  Right epididymis:  Normal in size and appearance.  Left epididymis:  Mildly enlarged and hypervascular  Hydrocele: Tiny right hydrocele. Large complex left hydrocele with multiple septations.  Varicocele:  None visualized.  Pulsed Doppler interrogation of both testes demonstrates increased blood flow to both testicles.  IMPRESSION: Findings consistent with bilateral orchitis and left epididymitis, with large complex hydrocele left scrotum.   Electronically Signed   By: Skipper Cliche M.D.   On: 10/08/2013 17:37      Impression/Recommendation  Orchitis/left epididymitis: Urine culture pending  -  Per urine culture sensitivity, PO abx should be given for 10 days after d/c from hospital, preferably a fluoroquinolone or doxycycline -  NSAIDs for Pain and swelling in scrotum which may not be completely resolved up to 3 weeks after completion of abx -  Scrotal support w/ tight brief during the day and rolled up washcloth placed under the scrotum at nigth -  Ice to area 15 min 4 x daily -  Follow up with his urologist at [place] 10/09/2013, 2:09 PM

## 2013-10-09 NOTE — Progress Notes (Signed)
Patient seen and examined, database reviewed. Here with abdominal pain, fever, hematuria, scrotal edema. Had prostatectomy in March at [facility] for a prostate cancer. Had signs of sepsis and was admitted to ICU. US showed orchitis and left epididymitis . UA with evidence for infection.Continue abx and follow GU recommendations. Stable for transfer to floor. Will advance diet as he is tolerating clears without issue.  Domingo Mend, MD [facility] Pager: [phone]

## 2013-10-09 NOTE — Progress Notes (Signed)
Report given to receiving RN, pt transferred via bed to 1313.

## 2013-10-09 NOTE — Consult Note (Signed)
Pt personally seen and examined. I reviewed all [facility] notes and [facility] notes on [place]. Foley d/c about 2 weeks ago. CT A/P from 3/21 and yesterday (much improved pelvic fluid collections), Scrotal U/S left testicle/epididymis swelling and increased blood flow. Pt has been voiding OK. He noted large volume incontinence while he was sleeping at about 5 pm and hasn't voided since. C/o pain in left hip and left hemiscrotum. His WBC has improved. Cr normal. Urine Cx pending.  PE: NAD Lying in bed Abd - quite protuberant, soft, non-tender, bladder not palpable or tender Ext - no CCE or calf pain GU - significant swelling of left hemiscrotum, mild tenderness Bladder scan - 400 ml. Pt has not voided recently.   Imp - s/p RALP and BPLND at [facility] 3/18, foley out 3/31  Plan - Cont supportive care, abx. Improving, but epididymo-orchitis can takes weeks for swelling to resolve. Monitor serum Cr, wbc and PVR.  Will notify Dr. Louis Meckel of admission to follow.

## 2013-10-09 NOTE — Progress Notes (Signed)
CARE MANAGEMENT NOTE 10/09/2013  Patient:  DEKE, TILGHMAN   Account Number:  [id number]  Date Initiated:  10/09/2013  Documentation initiated by:  DAVIS,RHONDA  Subjective/Objective Assessment:   recent prostate surg, now presents with pain and abcess vs fld collection in pelvis.     Action/Plan:   home when stable   Anticipated DC Date:  10/12/2013   Anticipated DC Plan:  HOME/SELF CARE  In-house referral  NA      DC Planning Services  NA      PAC Choice  NA   Choice offered to / List presented to:  NA   DME arranged  NA      DME agency  NA     [place] arranged  NA      [place] agency  NA   Status of service:  In process, will continue to follow Medicare Important Message given?  NA - LOS <3 / Initial given by admissions (If response is "NO", the following Medicare IM given date fields will be blank) Date Medicare IM given:   Date Additional Medicare IM given:    Discharge Disposition:    Per UR Regulation:  Reviewed for med. necessity/level of care/duration of stay  If discussed at [place] of Stay Meetings, dates discussed:    Comments:  [id number]/Rhonda Eldridge Dace, [place], [place] [phone] Chart Reviewed for discharge and hospital needs. Discharge needs at time of review: None present will follow for needs. Review of patient progress due on [id number].

## 2013-10-09 NOTE — Progress Notes (Signed)
OT Cancellation Note  Patient Details Name: [name] MRN: [id number] DOB: 1950/09/23  Cancelled Treatment:    Reason Eval/Treat Not Completed: Patient not medically ready Pt on bedrest. Will recheck on pt next day. Kari Baars, OT  [place] [name] 10/09/2013, 2:04 PM

## 2013-10-09 NOTE — Progress Notes (Signed)
INITIAL NUTRITION ASSESSMENT  DOCUMENTATION CODES Per approved criteria  -Not Applicable   INTERVENTION: - Diet advancement per MD - Encouraged small frequent high protein meals/snacks once diet advanced to promote improved appetite and healing from surgery - Educated pt on diet therapy for constipation, provided handouts with RD contact information - Will continue to monitor   NUTRITION DIAGNOSIS: Inadequate oral intake related to clear liquid diet as evidenced by diet order.    Goal: Advance diet as tolerated to regular diet  Monitor:  Weights, labs, diet advancement, BM, bloating  Reason for Assessment: Malnutrition screening tool   63 y.o. male  Admitting Dx: Sepsis  ASSESSMENT: Pt with history of recent diagnosis of prostrate cancer, diagnosed in December 2014 and hadradical prostatectomy with bilateral pelvic lymphadenectomy performed on 09/13/2013 by Dr. Valentina Gu at [facility]. Patient reported that he has been having abdominal bloating, pain in the left flank and scrotal edema and swelling, started on Friday 3 days ago. Found to have sepsis likely due to UTI and possible pyelonephritis, bilateral orchitis with left epididymitis and mild ileus per CT of abdomen and pelvis.   Met with pt who reports having nausea/vomting for 1 day on Saturday PTA, currently under control with IV Zofran. Had a BM yesterday and today, brown and not formed. C/o poor appetite since surgery last month, states he was afraid to eat because when he would eat, he would feel pressure, and blood would come out around his catheter. Ate some Chinese food on Friday and then had a lot of bloating and pain, especially on his left side. Ate 100% of broth and jello this morning which he tolerated well. Reports improvement in bloating.    AST elevated   Height: Ht Readings from Last 1 Encounters:  10/08/13 5' 10"  (1.778 m)    Weight: Wt Readings from Last 1 Encounters:  10/08/13 200 lb (90.719 kg)     Ideal Body Weight: 166 lbs  % Ideal Body Weight: 120%  Wt Readings from Last 10 Encounters:  10/08/13 200 lb (90.719 kg)  04/13/13 206 lb (93.441 kg)  10/13/12 204 lb (92.534 kg)  04/04/12 213 lb (96.616 kg)  12/15/11 211 lb (95.709 kg)  10/12/11 212 lb (96.163 kg)  08/21/10 211 lb (95.709 kg)  06/16/10 226 lb (102.513 kg)  02/20/10 219 lb (99.338 kg)  09/05/09 219 lb (99.338 kg)    Usual Body Weight: 206 lbs in October 2014  % Usual Body Weight: 97%  BMI:  Body mass index is 28.7 kg/(m^2).  Estimated Nutritional Needs: Kcal: 1800-2000 Protein: 90-110g Fluid: 1.8-2L/day  Skin: WNL  Diet Order: Clear Liquid  EDUCATION NEEDS: -Education needs addressed - discussed diet therapy for constipation and provided handouts with RD contact information. Teach back method used, pt expressed understanding, expect good compliance.    Intake/Output Summary (Last 24 hours) at 10/09/13 1006 Last data filed at 10/09/13 0900  Gross per 24 hour  Intake 3915.47 ml  Output    450 ml  Net 3465.47 ml    Last BM: 4/13  Labs:   Recent Labs Lab 10/08/13 1530 10/09/13 0320  NA 135* 137  K 4.6 4.3  CL 95* 101  CO2 27 26  BUN 12 11  CREATININE 1.05 0.96  CALCIUM 9.7 8.7  GLUCOSE 126* 102*    CBG (last 3)  No results found for this basename: GLUCAP,  in the last 72 hours  Scheduled Meds: . docusate sodium  100 mg Oral BID  .  morphine  injection  4 mg Intravenous Once  . multivitamin with minerals  1 tablet Oral Daily  . piperacillin-tazobactam (ZOSYN)  IV  3.375 g Intravenous Q8H  . sodium chloride  1,000 mL Intravenous Once  . vancomycin  1,250 mg Intravenous Q12H  . vitamin C  500 mg Oral Daily    Continuous Infusions: . sodium chloride 100 mL/hr at 10/08/13 2259    Past Medical History  Diagnosis Date  . ALLERGIC RHINITIS 04/19/2009  . ASTHMA 04/19/2009  . COLONIC POLYPS, HX OF 04/19/2009  . OSTEOARTHRITIS 04/19/2009  . Eczema     Past Surgical  History  Procedure Laterality Date  . Joint replacement      left hip  . Prostatectomy      September 14, 2013    Earl College MS, RD, [place] Pager [phone] After Hours Pager

## 2013-10-10 LAB — CBC
HCT: 36 % — ABNORMAL LOW (ref 39.0–52.0)
Hemoglobin: 11.6 g/dL — ABNORMAL LOW (ref 13.0–17.0)
MCH: 31.7 pg (ref 26.0–34.0)
MCHC: 32.2 g/dL (ref 30.0–36.0)
MCV: 98.4 fL (ref 78.0–100.0)
Platelets: 318 10*3/uL (ref 150–400)
RBC: 3.66 MIL/uL — ABNORMAL LOW (ref 4.22–5.81)
RDW: 14.6 % (ref 11.5–15.5)
WBC: 12.9 10*3/uL — ABNORMAL HIGH (ref 4.0–10.5)

## 2013-10-10 LAB — BASIC METABOLIC PANEL
BUN: 8 mg/dL (ref 6–23)
CO2: 25 meq/L (ref 19–32)
Calcium: 8.6 mg/dL (ref 8.4–10.5)
Chloride: 98 mEq/L (ref 96–112)
Creatinine, Ser: 0.96 mg/dL (ref 0.50–1.35)
GFR calc Af Amer: >90 mL/min (ref >90)
GFR calc non Af Amer: 86 mL/min — ABNORMAL LOW (ref >90)
Glucose, Bld: 87 mg/dL (ref 70–99)
Potassium: 4.2 mEq/L (ref 3.7–5.3)
Sodium: 132 mEq/L — ABNORMAL LOW (ref 137–147)

## 2013-10-10 LAB — URINE CULTURE
Colony Count: >=100000
Special Requests: NORMAL

## 2013-10-10 LAB — PROCALCITONIN: Procalcitonin: 0.21 ng/mL

## 2013-10-10 NOTE — Evaluation (Signed)
Occupational Therapy Evaluation Patient Details Name: [name] MRN: [id number] DOB: 14-Jul-1950 Today's Date: 10/10/2013    History of Present Illness Patient is a 63 year old male with history of recent diagnosis of prostrate cancer, diagnosed in December 2014 and hadradical prostatectomy with bilateral pelvic lymphadenectomy performed on 09/13/2013 by Dr. Valentina Gu at [facility]. Patient admitted with abdominal bloating, pain in the left flank and scrotal edema and swelling   Clinical Impression   Pt's biggest limitation is his pain level but he can perform functional mobility and ADL except for LB self care on his own. He states family can assist with this. No other acute OT needs.    Follow Up Recommendations  No OT follow up    Equipment Recommendations  None recommended by OT    Recommendations for Other Services       Precautions / Restrictions Precautions Precautions: None      Mobility Bed Mobility Overal bed mobility: Modified Independent                Transfers Overall transfer level: Modified independent Equipment used: None                  Balance                                            ADL Overall ADL's : Needs assistance/impaired Eating/Feeding: Independent;Bed level   Grooming: Oral care;Standing;Independent   Upper Body Bathing: Independent;Sitting   Lower Body Bathing: Minimal assistance;Sit to/from stand   Upper Body Dressing : Independent;Sitting   Lower Body Dressing: Minimal assistance;Sit to/from stand   Toilet Transfer: Modified Independent;Comfort height toilet   Toileting- Clothing Manipulation and Hygiene: Independent;Sit to/from stand         General ADL Comments: Pt up in room with OT and able to transfer into bathroom on and off toilet, stand at the sink to brush teeth and then transferred to recliner.  Pt not able to tolerate sitting in the recliner more than 5 minutes due to intense scrotal  pain. Pt repositioned back in bed. He states he has been unable to don socks, shoes PTA due to lower abdominal/scrotal pain but has family that has been assisting with LB dressing. Offered AE education but pt declines wanting to obtain AE due to financial concerns. He states family can continue to assist. Overall pt is moving around well despite signficant pain and do not feel he needs further OT. He declines any concerns regarding stairs or other mobility needs.  (No PT ordered)     Vision                     Perception     Praxis      Pertinent Vitals/Pain 10/10 scrotal area; informed nursing ; with activity     Hand Dominance     Extremity/Trunk Assessment Upper Extremity Assessment Upper Extremity Assessment: Overall WFL for tasks assessed           Communication Communication Communication: No difficulties   Cognition Arousal/Alertness: Awake/alert Behavior During Therapy: WFL for tasks assessed/performed Overall Cognitive Status: Within Functional Limits for tasks assessed                     General Comments       Exercises       Shoulder Instructions  Home Living Family/patient expects to be discharged to:: Private residence Living Arrangements: Alone Available Help at Discharge: Family Type of Home: Apartment Home Access: Stairs to enter Technical brewer of Steps: 20 Entrance Stairs-Rails: Right;Left Home Layout: One level     Bathroom Shower/Tub: Teacher, early years/pre: Standard     Home Equipment: Environmental consultant - 2 wheels;Crutches          Prior Functioning/Environment Level of Independence: Needs assistance    ADL's / Homemaking Assistance Needed: pt states since his surgery he has needed assist with LB dressing including socks, shoes by his daughter.        OT Diagnosis:     OT Problem List:     OT Treatment/Interventions:      OT Goals(Current goals can be found in the care plan section) Acute Rehab  OT Goals Patient Stated Goal: decrease pain  OT Frequency:     Barriers to D/C:            Co-evaluation              End of Session    Activity Tolerance: Patient limited by pain Patient left: in bed;with call bell/phone within reach   Time: 0915-0934 OT Time Calculation (min): 19 min Charges:  OT General Charges $OT Visit: 1 Procedure OT Evaluation $Initial OT Evaluation Tier I: 1 Procedure OT Treatments $Therapeutic Activity: 8-22 mins G-Codes:    Jules Schick [phone] 10/10/2013, 9:46 AM

## 2013-10-10 NOTE — Progress Notes (Signed)
[name] is a patient of mine from clinic.  The patient opted to have his prostate cancer treatment at [facility].  He is status post robotic-assisted laparoscopic prostatectomy with bilateral pelvic node dissection.  Surgery was on 09/13/13.  His postoperative course was complicated by postoperative scrotal and pelvic swelling and a Foley catheter was left in one week longer than planned.  Foley catheter was removed about 1 week ago the patient has had significant pain and trouble voiding since.  Intv: Patient is currently being treated for left epididymal orchitis based on his clinical exam and ultrasound with vancomycin and Zosyn.  His urine culture has grown greater than 100,000 gram-negative rods.  Patient most likely developed a UTI which descended down into his left testicle.  In addition the patient has gross hematuria and trouble voiding. The patient states that he is leaking into a towel that he is unable to void into the urinal secondary to pain. The patient's pain is being controlled with IV Dilaudid and by mouth Vicodin A small plastic surgical clip was removed from his urethra yesterday afternoon.   Scheduled Meds: . docusate sodium  100 mg Oral BID  .  morphine injection  4 mg Intravenous Once  . multivitamin with minerals  1 tablet Oral Daily  . piperacillin-tazobactam (ZOSYN)  IV  3.375 g Intravenous Q8H  . sodium chloride  1,000 mL Intravenous Once  . vancomycin  1,250 mg Intravenous Q12H  . vitamin C  500 mg Oral Daily   Continuous Infusions: . sodium chloride 100 mL/hr at 10/09/13 2228   PRN Meds:.acetaminophen, acetaminophen, albuterol, alum & mag hydroxide-simeth, bisacodyl, diphenhydrAMINE, HYDROcodone-acetaminophen, HYDROmorphone (DILAUDID) injection, hydrOXYzine, ondansetron (ZOFRAN) IV, ondansetron, senna-docusate  Filed Vitals:   10/09/13 1600 10/09/13 1936 10/09/13 2048 10/10/13 0554  BP: 129/88 163/89 135/93 128/80  Pulse:  96 98 92  Temp: 98.4 F  (36.9 C)  99.5 F (37.5 C) 98.9 F (37.2 C)  TempSrc: Oral  Oral Oral  Resp: 17  20 16   Height:   5\' 10"  (1.778 m)   Weight:   100.5 kg (221 lb 9 oz)   SpO2: 99%  94% 97%   NAD Atraumatic and normocephalic head Moist mucous membranes No cervical or inguinal lymphadenopathy No significant edema, regular heart rate non labored breathing. Abdomen is soft/non-tender, incisions are clean dry and intact and appeared be healing well. Left hemi-scrotum is edematous, firm and extremely tender to touch.  He has no penile edema. The right hemiscrotum is soft the right testicle is nontender There is no lower extremity edema   Recent Labs  10/08/13 1530 10/09/13 0320 10/10/13 0340  WBC 20.5* 18.5* 12.9*  HGB 14.2 12.4* 11.6*  HCT 42.4 38.6* 36.0*    Recent Labs  10/08/13 1530 10/09/13 0320 10/10/13 0340  NA 135* 137 132*  K 4.6 4.3 4.2  CL 95* 101 98  CO2 27 26 25   GLUCOSE 126* 102* 87  BUN 12 11 8   CREATININE 1.05 0.96 0.96  CALCIUM 9.7 8.7 8.6   No results found for this basename: LABPT, INR,  in the last 72 hours No results found for this basename: LABURIN,  in the last 72 hours Results for orders placed during the hospital encounter of 10/08/13  CULTURE, BLOOD (ROUTINE X 2)     Status: None   Collection Time    10/08/13  3:50 PM      Result Value Ref Range Status   Specimen Description BLOOD LEFT HAND  2  ML IN [facility] BOTTLE   Final   Special Requests Immunocompromised   Final   Culture  Setup Time     Final   Value: 10/08/2013 20:56     Performed at [facility]   Culture     Final   Value:        BLOOD CULTURE RECEIVED NO GROWTH TO DATE CULTURE WILL BE HELD FOR 5 DAYS BEFORE ISSUING A FINAL NEGATIVE REPORT     Performed at [facility]   Report Status PENDING   Incomplete  CULTURE, BLOOD (ROUTINE X 2)     Status: None   Collection Time    10/08/13  3:55 PM      Result Value Ref Range Status   Specimen Description BLOOD LEFT ANTECUBITAL   Final    Special Requests BOTTLES DRAWN AEROBIC AND ANAEROBIC 5CC   Final   Culture  Setup Time     Final   Value: 10/08/2013 20:56     Performed at [facility]   Culture     Final   Value:        BLOOD CULTURE RECEIVED NO GROWTH TO DATE CULTURE WILL BE HELD FOR 5 DAYS BEFORE ISSUING A FINAL NEGATIVE REPORT     Performed at [facility]   Report Status PENDING   Incomplete  URINE CULTURE     Status: None   Collection Time    10/08/13  6:02 PM      Result Value Ref Range Status   Specimen Description URINE, CLEAN CATCH   Final   Special Requests Normal   Final   Culture  Setup Time     Final   Value: 10/09/2013 03:35     Performed at [facility]     Final   Value: >=100,000 COLONIES/ML     Performed at [facility]   Culture     Final   Value: [name]     Performed at [facility]   Report Status PENDING   Incomplete  MRSA PCR SCREENING     Status: None   Collection Time    10/08/13  9:03 PM      Result Value Ref Range Status   MRSA by PCR NEGATIVE  NEGATIVE Final   Comment:            The GeneXpert MRSA Assay (FDA     approved for NASAL specimens     only), is one component of a     comprehensive MRSA colonization     surveillance program. It is not     intended to diagnose MRSA     infection nor to guide or     monitor treatment for     MRSA infections.   Imp: I suspect the patient had a UTI which had descended down into his left hemiscrotum/testicle.  He now has epididymal orchitis.  His white blood cell count appears to be improving significantly on IV antibiotics. I would expect the patient's pain to remain free severe initially as has bacterial epididymal orchitisit is extremely painful.  It is not clear whether the patient is able to empty his bladder and not warranted the significance of his hematuria.  Recommendations: Continue with broad-spectrum antibiotics, follow urine cultures and narrow as  appropriate.  Patient will likely need 3-4 weeks of antibiotics to treat his orchitis. I also ordered bladder scans every 4 hours to ensure the patient  is not in overflow incontinence and he is able to void. In addition, I ordered the patient's urine to be retracted each time he voids saving a small amount that is labeled and stored in the bathroom so that we can evaluate the color of his urine over time.  It may be, the patient ultimately requires catheterization.  I've been in touch with Dr. Blair Promise, M.D., the patient's urologist from [facility].  At this time, there is no indication to transfer the patient.  However, if the patient fails progress he may need to be transferred.  I communicated this plan to the patient as well.

## 2013-10-10 NOTE — Progress Notes (Signed)
Today the patient has had PVRs of 40 and 133cc.  His urine has been clearing from dark red to the most recent void which was straw colored.    His urine culture grew pseudomonas sensitive to cipro.  Recommendation: cipro x 4 weeks. F/u with Dr. Brendia Sacks at [facility] this week.

## 2013-10-10 NOTE — Progress Notes (Signed)
[facility] PROGRESS NOTE  Quanell Loughney NFA:[id number] DOB: June 14, 1951 DOA: 10/08/2013 PCP: [name], MD  Assessment/Plan: UTI/Orchitis/Epididymitis -Urine cx growing >100,000 GNR. -Continue zosyn pending speciation but will DC vanc. -Appreciate GU recommendations. -Will need to follow up with GU at [facility] once cx speciates and on appropriate abx therapy.  Ileus -Clinically resolved. -Tolerating regular diet without issues.  Code Status: Full Code Family Communication: patient only  Disposition Plan: Home when ready; likely 24-48 hours.   Consultants:  GU   Antibiotics:  Zosyn   Subjective: Still with significant scrotal edema and pain.  Objective: Filed Vitals:   10/09/13 1936 10/09/13 2048 10/10/13 0554 10/10/13 1400  BP: 163/89 135/93 128/80 153/93  Pulse: 96 98 92 97  Temp:  99.5 F (37.5 C) 98.9 F (37.2 C) 98.3 F (36.8 C)  TempSrc:  Oral Oral Oral  Resp:  20 16 18   Height:  5\' 10"  (1.778 m)    Weight:  100.5 kg (221 lb 9 oz)    SpO2:  94% 97% 98%    Intake/Output Summary (Last 24 hours) at 10/10/13 1718 Last data filed at 10/10/13 1558  Gross per 24 hour  Intake 2911.67 ml  Output    575 ml  Net 2336.67 ml   Filed Weights   10/08/13 1929 10/09/13 2048  Weight: 90.719 kg (200 lb) 100.5 kg (221 lb 9 oz)    Exam:   General:  AA Ox3  Cardiovascular: RRR  Respiratory: CTA B  Abdomen: S/NT/ND/+BS  Extremities: trace bilateral edema   Neurologic:  Non-focal  Data Reviewed: Basic Metabolic Panel:  Recent Labs Lab 10/08/13 1530 10/09/13 0320 10/10/13 0340  NA 135* 137 132*  K 4.6 4.3 4.2  CL 95* 101 98  CO2 27 26 25   GLUCOSE 126* 102* 87  BUN 12 11 8   CREATININE 1.05 0.96 0.96  CALCIUM 9.7 8.7 8.6   Liver Function Tests:  Recent Labs Lab 10/08/13 1530  AST 41*  ALT 43  ALKPHOS 99  BILITOT 0.7  PROT 8.8*  ALBUMIN 3.5   No results found for this basename: LIPASE, AMYLASE,  in the last 168  hours No results found for this basename: AMMONIA,  in the last 168 hours CBC:  Recent Labs Lab 10/08/13 1530 10/09/13 0320 10/10/13 0340  WBC 20.5* 18.5* 12.9*  NEUTROABS 17.6*  --   --   HGB 14.2 12.4* 11.6*  HCT 42.4 38.6* 36.0*  MCV 97.7 99.5 98.4  PLT 397 313 318   Cardiac Enzymes: No results found for this basename: CKTOTAL, CKMB, CKMBINDEX, TROPONINI,  in the last 168 hours BNP (last 3 results) No results found for this basename: PROBNP,  in the last 8760 hours CBG: No results found for this basename: GLUCAP,  in the last 168 hours  Recent Results (from the past 240 hour(s))  CULTURE, BLOOD (ROUTINE X 2)     Status: None   Collection Time    10/08/13  3:50 PM      Result Value Ref Range Status   Specimen Description BLOOD LEFT HAND  2 ML IN [facility] BOTTLE   Final   Special Requests Immunocompromised   Final   Culture  Setup Time     Final   Value: 10/08/2013 20:56     Performed at [facility]   Culture     Final   Value:        BLOOD CULTURE RECEIVED NO GROWTH TO DATE CULTURE WILL BE HELD FOR  5 DAYS BEFORE ISSUING A FINAL NEGATIVE REPORT     Performed at [facility]   Report Status PENDING   Incomplete  CULTURE, BLOOD (ROUTINE X 2)     Status: None   Collection Time    10/08/13  3:55 PM      Result Value Ref Range Status   Specimen Description BLOOD LEFT ANTECUBITAL   Final   Special Requests BOTTLES DRAWN AEROBIC AND ANAEROBIC 5CC   Final   Culture  Setup Time     Final   Value: 10/08/2013 20:56     Performed at [facility]   Culture     Final   Value:        BLOOD CULTURE RECEIVED NO GROWTH TO DATE CULTURE WILL BE HELD FOR 5 DAYS BEFORE ISSUING A FINAL NEGATIVE REPORT     Performed at [facility]   Report Status PENDING   Incomplete  URINE CULTURE     Status: None   Collection Time    10/08/13  6:02 PM      Result Value Ref Range Status   Specimen Description URINE, CLEAN CATCH   Final   Special Requests Normal   Final    Culture  Setup Time     Final   Value: 10/09/2013 03:35     Performed at [place]     Final   Value: >=100,000 COLONIES/ML     Performed at [facility]   Culture     Final   Value: PSEUDOMONAS AERUGINOSA     Performed at [facility]   Report Status 10/10/2013 FINAL   Final   Organism ID, Bacteria PSEUDOMONAS AERUGINOSA   Final  MRSA PCR SCREENING     Status: None   Collection Time    10/08/13  9:03 PM      Result Value Ref Range Status   MRSA by PCR NEGATIVE  NEGATIVE Final   Comment:            The GeneXpert MRSA Assay (FDA     approved for NASAL specimens     only), is one component of a     comprehensive MRSA colonization     surveillance program. It is not     intended to diagnose MRSA     infection nor to guide or     monitor treatment for     MRSA infections.     Studies: Dg Chest 2 View  10/08/2013   CLINICAL DATA:  Shortness of breath and pain with history of asthma and prostate malignancy  EXAM: CHEST  2 VIEW  COMPARISON:  DG CHEST 1V PORT dated 10/05/2006  FINDINGS: The lungs are adequately inflated. There is no focal pneumonia. Subtle nodularity projects in the region of the nipples bilaterally lying at the level of the posterior eighth ribs bilaterally. The cardiac silhouette is normal in size. The pulmonary vascularity is not engorged. The mediastinum is normal in width. There is no pleural effusion. There is mild tortuosity of the descending thoracic aorta. The observed portions of the bony thorax exhibit no acute abnormalities.  IMPRESSION: There findings which likely reflect known reactive airway disease or COPD. There is no evidence of pneumonia nor CHF or other acute cardiopulmonary abnormality.   Electronically Signed   By: David  Martinique   On: 10/08/2013 17:52   Dg Pelvis 1-2 Views  10/08/2013   CLINICAL DATA:  Groin swelling ; history of  prostate malignancy.  EXAM: PELVIS - 1-2 VIEW  COMPARISON:  None.  FINDINGS: The  bony pelvis appears adequately mineralized. There is no lytic nor blastic lesion or evidence of an acute fracture. 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 There are degenerative changes of the right hip joint. There is contrast in the nondistended urinary bladder. There may be mass effect upon the lateral aspects of the urinary bladder. There are degenerative changes of the lower lumbar spine.  IMPRESSION: There is no acute bony abnormality of the pelvis.   Electronically Signed   By: David  [place]   On: 10/08/2013 17:59   US Scrotum  10/08/2013   CLINICAL DATA:  Bilateral testicles swelling, rule out torsion  EXAM: SCROTAL ULTRASOUND  DOPPLER ULTRASOUND OF THE TESTICLES  TECHNIQUE: Complete ultrasound examination of the testicles, epididymis, and other scrotal structures was performed. Color and spectral Doppler ultrasound were also utilized to evaluate blood flow to the testicles.  COMPARISON:  None.  FINDINGS: Right testicle  Measurements: 47 x 19 x 24 mm. No mass or microlithiasis visualized.  Left testicle  Measurements: 40 x 23 x 16 mm. No mass or microlithiasis visualized.  Right epididymis:  Normal in size and appearance.  Left epididymis:  Mildly enlarged and hypervascular  Hydrocele: Tiny right hydrocele. Large complex left hydrocele with multiple septations.  Varicocele:  None visualized.  Pulsed Doppler interrogation of both testes demonstrates increased blood flow to both testicles.  IMPRESSION: Findings consistent with bilateral orchitis and left epididymitis, with large complex hydrocele left scrotum.   Electronically Signed   By: Skipper Cliche M.D.   On: 10/08/2013 17:37   Ct Abdomen Pelvis W Contrast  10/08/2013   CLINICAL DATA:  Increasing left-sided abdominal pain with vomiting and intermittent hematuria. Recent prostatectomy with subsequent penile swelling.  EXAM: CT ABDOMEN AND PELVIS WITH CONTRAST  TECHNIQUE: Multidetector CT imaging of the abdomen and pelvis was performed using the  standard protocol following bolus administration of intravenous contrast.  CONTRAST:  146mL OMNIPAQUE IOHEXOL 300 MG/ML SOLN, 2mL OMNIPAQUE IOHEXOL 300 MG/ML SOLN  COMPARISON:  US SCROTUM dated 10/08/2013; CT PELVIS W/CM dated 09/16/2013; CT ABD W/CM dated 02/10/2007  FINDINGS: There is stable mild atelectasis or scarring in both lung bases. No significant pleural or pericardial effusion is present. There is a small hiatal hernia.  Hepatic steatosis appears mildly improved. There is no focal hepatic abnormality. The spleen, gallbladder, pancreas, adrenal glands and kidneys appear normal. There is no hydronephrosis.  The stomach and small bowel appear normal without significant distention or wall thickening. The colon is diffusely fluid filled, but shows no wall thickening or surrounding inflammation. The appendix appears normal.  Foley catheter has been removed. There is bladder wall thickening with mild residual perivesical soft tissue stranding. 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 Fluid collections posterior to both external iliac arteries also appear slightly smaller and better defined, measuring 3.3 x 2.1 cm on the right and 3.4 x 1.6 cm on the left. There is minimal residual air within the right-sided collection. There is no evidence of iliac vein DVT. Edematous changes within the perineum and upper scrotum appear improved compared with the recent study.  IMPRESSION: 1. Evolving postoperative pelvic fluid collections, likely seromas or lymphoceles. The perineal and perivesical soft tissue stranding noted previously have improved. 2. Fluid-filled colon, likely ileus. No evidence of bowel obstruction or  new extraluminal fluid collection. 3. No acute abdominal findings.  Mild hepatic steatosis.   Electronically Signed   By: Camie Patience M.D.   On:  10/08/2013 18:09   [place] Art/ven Flow Abd Pelv Doppler  10/08/2013   CLINICAL DATA:  Bilateral testicles swelling, rule out torsion  EXAM: SCROTAL ULTRASOUND  DOPPLER ULTRASOUND OF THE TESTICLES  TECHNIQUE: Complete ultrasound examination of the testicles, epididymis, and other scrotal structures was performed. Color and spectral Doppler ultrasound were also utilized to evaluate blood flow to the testicles.  COMPARISON:  None.  FINDINGS: Right testicle  Measurements: 47 x 19 x 24 mm. No mass or microlithiasis visualized.  Left testicle  Measurements: 40 x 23 x 16 mm. No mass or microlithiasis visualized.  Right epididymis:  Normal in size and appearance.  Left epididymis:  Mildly enlarged and hypervascular  Hydrocele: Tiny right hydrocele. Large complex left hydrocele with multiple septations.  Varicocele:  None visualized.  Pulsed Doppler interrogation of both testes demonstrates increased blood flow to both testicles.  IMPRESSION: Findings consistent with bilateral orchitis and left epididymitis, with large complex hydrocele left scrotum.   Electronically Signed   By: Skipper Cliche M.D.   On: 10/08/2013 17:37    Scheduled Meds: . docusate sodium  100 mg Oral BID  .  morphine injection  4 mg Intravenous Once  . multivitamin with minerals  1 tablet Oral Daily  . piperacillin-tazobactam (ZOSYN)  IV  3.375 g Intravenous Q8H  . sodium chloride  1,000 mL Intravenous Once  . vitamin C  500 mg Oral Daily   Continuous Infusions: . sodium chloride 100 mL/hr at 10/09/13 2228    Principal Problem:   Sepsis Active Problems:   UTI (lower urinary tract infection)   Pyelonephritis   Scrotal edema   Abdominal pain   Ileus   Orchitis and epididymitis    Time spent: 25 minutes. Greater than 50% of this time was spent in direct contact with the patient coordinating care.    [facility] Pager [phone]  If 7PM-7AM, please contact night-coverage at [email or web address],  password [facility] 10/10/2013, 5:18 PM  LOS: 2 days

## 2013-10-11 LAB — CBC
HEMATOCRIT: 36.5 % — AB (ref 39.0–52.0)
HEMOGLOBIN: 11.6 g/dL — AB (ref 13.0–17.0)
MCH: 31.3 pg (ref 26.0–34.0)
MCHC: 31.8 g/dL (ref 30.0–36.0)
MCV: 98.4 fL (ref 78.0–100.0)
Platelets: 321 10*3/uL (ref 150–400)
RBC: 3.71 MIL/uL — AB (ref 4.22–5.81)
RDW: 14.6 % (ref 11.5–15.5)
WBC: 8 10*3/uL (ref 4.0–10.5)

## 2013-10-11 LAB — BASIC METABOLIC PANEL
BUN: 7 mg/dL (ref 6–23)
CO2: 23 meq/L (ref 19–32)
CREATININE: 0.85 mg/dL (ref 0.50–1.35)
Calcium: 8.7 mg/dL (ref 8.4–10.5)
Chloride: 100 mEq/L (ref 96–112)
GFR calc Af Amer: >90 mL/min (ref >90)
GFR calc non Af Amer: >90 mL/min (ref >90)
Glucose, Bld: 89 mg/dL (ref 70–99)
Potassium: 4.7 mEq/L (ref 3.7–5.3)
SODIUM: 135 meq/L — AB (ref 137–147)

## 2013-10-11 MED ORDER — CIPROFLOXACIN HCL 500 MG PO TABS: 500.0000 mg | ORAL_TABLET | Freq: Two times a day (BID) | ORAL | Status: AC

## 2013-10-11 MED ORDER — PROMETHAZINE HCL 12.5 MG PO TABS: 12.5000 mg | ORAL_TABLET | Freq: Four times a day (QID) | ORAL | Status: DC | PRN

## 2013-10-11 MED ORDER — OXYCODONE HCL 5 MG PO TABS: 5.0000 mg | ORAL_TABLET | ORAL | Status: DC | PRN

## 2013-10-11 MED ORDER — CIPROFLOXACIN HCL 500 MG PO TABS
500.0000 mg | ORAL_TABLET | Freq: Two times a day (BID) | ORAL | Status: DC
  Administered 2013-10-11: 500 mg via ORAL
  Filled 2013-10-11 (×3): qty 1

## 2013-10-11 NOTE — Progress Notes (Signed)
Patient received discharge instructions and verbalized understanding without further questions. Patient follow up appointments and medications discussed. Patient prescriptions given. Patient to be taken downstairs via wheelchair to be discharged home.

## 2013-10-11 NOTE — Discharge Summary (Signed)
Physician Discharge Summary  [name]:[id number] DOB: Mar 06, 1951 DOA: 10/08/2013  PCP: [name], MD  Admit date: 10/08/2013 Discharge date: 10/11/2013  Time spent: 25 minutes  Recommendations for Outpatient Follow-up:  1. New medication: Cipro 500 mg by mouth twice a day x5 days for total of 10 days of therapy 2. New medication: OxyIR 5 mg every 4 hours when necessary for pain. (Patient will be advised to discontinue previous Percocet) 3. New medication: Phenergan 12.5 mg every 6 hours when necessary for nausea 4. Patient will followup with urology at [facility], Dr.Hemal in the next 1-2 weeks  Discharge Diagnoses:  Principal Problem:   Sepsis Active Problems:   UTI (lower urinary tract infection)   Pyelonephritis   Scrotal edema   Abdominal pain   Ileus   Orchitis and epididymitis   Discharge Condition: Improved, being discharged home  Diet recommendation: Regular  Filed Weights   10/08/13 1929 10/09/13 2048  Weight: 90.719 kg (200 lb) 100.5 kg (221 lb 9 oz)    History of present illness:  63 year old African American male with past medical history of recently diagnosed prostate cancer status post radical prostatectomy and bilateral pelvic lymphadenectomy done 3/18 by urology at [facility]. Patient started having pain in left flank  plus scrotal swelling and edema starting 2 days prior.in the emergency room, he was noted to have a white blood cell count 20.5, lactic acid level of 1.5 and CT abdomen and pelvis noted evolving postoperative pelvic fluid collections plus urinalysis positive for UTI. Patient admitted for sepsis, bleeding criteria given tachycardia, hypotension and leukocytosis. This was initially felt to be secondary to UTI to the hospitalist service. Urology was consulted.   Hospital Course:  Principal Problem:   Sepsis: Initially thought to be secondary to UTI. Urology evaluated patient with worsening scrotal pain by hospital day 2, felt  more likely this was related to orchitis/left epididymitis, confirmed by ultrasound. Urine culture sent and patient started on IV Rocephin. Blood cultures remained negative. Patient was continued on IV antibiotics until urine positive for Pseudomonas pansensitive. At this 0.4/15, he had remained afebrile and white blood cell count had normalized. At this point, he was felt to be safe for discharge home on one week of Cipro. Active Problems:   UTI (lower urinary tract infection): Secondary to orchitis. See above    Scrotal edema: Secondary to orchitis and epididymitis. Improving.    Abdominal pain : Referred pain secondary to orchitis and epididymitis. Better.     Ileus   Orchitis and epididymitis: As above.    Procedures:  None  Consultations:  Urology  Discharge Exam: Filed Vitals:   10/11/13 0446  BP: 150/96  Pulse: 86  Temp: 98.1 F (36.7 C)  Resp: 12    General: Alert and oriented x3, no acute distress Cardiovascular: Regular rate and rhythm, S1-S2 Respiratory: Clear auscultation bilaterally Scrotum: slightly enlarged testicles and penis with mild induration, but minimal if any tenderness  Discharge Instructions You were cared for by a hospitalist during your hospital stay. If you have any questions about your discharge medications or the care you received while you were in the hospital after you are discharged, you can call the unit and asked to speak with the hospitalist on call if the hospitalist that took care of you is not available. Once you are discharged, your primary care physician will handle any further medical issues. Please note that NO REFILLS for any discharge medications will be authorized once you are discharged, as it  is imperative that you return to your primary care physician (or establish a relationship with a primary care physician if you do not have one) for your aftercare needs so that they can reassess your need for medications and monitor your lab  values.  Discharge Orders   Future Orders Complete By Expires   Diet - low sodium heart healthy  As directed    Increase activity slowly  As directed        Medication List    STOP taking these medications       oxyCODONE-acetaminophen 5-325 MG per tablet  Commonly known as:  PERCOCET/ROXICET      TAKE these medications       albuterol 108 (90 BASE) MCG/ACT inhaler  Commonly known as:  PROAIR HFA  Inhale 2 puffs into the lungs every 6 (six) hours as needed for wheezing.     ciprofloxacin 500 MG tablet  Commonly known as:  CIPRO  Take 1 tablet (500 mg total) by mouth 2 (two) times daily.     Fish Oil 1000 MG Caps  Take 2 capsules by mouth daily.     ibuprofen 200 MG tablet  Commonly known as:  ADVIL,MOTRIN  Take 400 mg by mouth every 6 (six) hours as needed for moderate pain.     multivitamin with minerals Tabs tablet  Take 1 tablet by mouth daily.     oxyCODONE 5 MG immediate release tablet  Commonly known as:  Oxy IR/ROXICODONE  Take 1 tablet (5 mg total) by mouth every 4 (four) hours as needed for severe pain.     promethazine 12.5 MG tablet  Commonly known as:  PHENERGAN  Take 1 tablet (12.5 mg total) by mouth every 6 (six) hours as needed for nausea or vomiting.     SENNA PLUS 8.6-50 MG per tablet  Generic drug:  senna-docusate  Take 1 tablet by mouth daily as needed for mild constipation (constipation).     triamcinolone cream 0.1 %  Commonly known as:  KENALOG  Apply 1 application topically 2 (two) times daily as needed.     vitamin C 500 MG tablet  Commonly known as:  ASCORBIC ACID  Take 500 mg by mouth daily.       Allergies  Allergen Reactions  . Horse-Derived Products Anaphylaxis       Follow-up Information   Follow up with [name], MD. (As needed)    Specialty:  Internal Medicine   Contact information:   [place] [address] [phone]       Follow up with [facility], MD In 1 week.    Specialty:  Urology   Contact information:   [place] [address] [phone]        The results of significant diagnostics from this hospitalization (including imaging, microbiology, ancillary and laboratory) are listed below for reference.    Significant Diagnostic Studies: Dg Chest 2 View  10/08/2013   CLINICAL DATA:  Shortness of breath and pain with history of asthma and prostate malignancy  EXAM: CHEST  2 VIEW  COMPARISON:  DG CHEST 1V PORT dated 10/05/2006  FINDINGS: The lungs are adequately inflated. There is no focal pneumonia. Subtle nodularity projects in the region of the nipples bilaterally lying at the level of the posterior eighth ribs bilaterally. The cardiac silhouette is normal in size. The pulmonary vascularity is not engorged. The mediastinum is normal in width. There is no pleural effusion. There is mild tortuosity of  the descending thoracic aorta. The observed portions of the bony thorax exhibit no acute abnormalities.  IMPRESSION: There findings which likely reflect known reactive airway disease or COPD. There is no evidence of pneumonia nor CHF or other acute cardiopulmonary abnormality.   Electronically Signed   By: David  Martinique   On: 10/08/2013 17:52   Dg Pelvis 1-2 Views  10/08/2013   CLINICAL DATA:  Groin swelling ; history of prostate malignancy.  EXAM: PELVIS - 1-2 VIEW  COMPARISON:  None.  FINDINGS: The bony pelvis appears adequately mineralized. There is no lytic nor blastic lesion or evidence of an acute fracture. 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 There are degenerative changes of the right hip joint. There is contrast in the nondistended urinary bladder. There may be mass effect upon the lateral aspects of the urinary bladder. There are degenerative changes of the lower lumbar spine.  IMPRESSION: There is no acute bony abnormality of the pelvis.   Electronically Signed   By: David  Martinique   On: 10/08/2013 17:59   US Scrotum  10/08/2013    CLINICAL DATA:  Bilateral testicles swelling, rule out torsion  EXAM: SCROTAL ULTRASOUND  DOPPLER ULTRASOUND OF THE TESTICLES  TECHNIQUE: Complete ultrasound examination of the testicles, epididymis, and other scrotal structures was performed. Color and spectral Doppler ultrasound were also utilized to evaluate blood flow to the testicles.  COMPARISON:  None.  FINDINGS: Right testicle  Measurements: 47 x 19 x 24 mm. No mass or microlithiasis visualized.  Left testicle  Measurements: 40 x 23 x 16 mm. No mass or microlithiasis visualized.  Right epididymis:  Normal in size and appearance.  Left epididymis:  Mildly enlarged and hypervascular  Hydrocele: Tiny right hydrocele. Large complex left hydrocele with multiple septations.  Varicocele:  None visualized.  Pulsed Doppler interrogation of both testes demonstrates increased blood flow to both testicles.  IMPRESSION: Findings consistent with bilateral orchitis and left epididymitis, with large complex hydrocele left scrotum.   Electronically Signed   By: Skipper Cliche M.D.   On: 10/08/2013 17:37   Ct Abdomen Pelvis W Contrast  10/08/2013   CLINICAL DATA:  Increasing left-sided abdominal pain with vomiting and intermittent hematuria. Recent prostatectomy with subsequent penile swelling.  EXAM: CT ABDOMEN AND PELVIS WITH CONTRAST  TECHNIQUE: Multidetector CT imaging of the abdomen and pelvis was performed using the standard protocol following bolus administration of intravenous contrast.  CONTRAST:  19mL OMNIPAQUE IOHEXOL 300 MG/ML SOLN, 52mL OMNIPAQUE IOHEXOL 300 MG/ML SOLN  COMPARISON:  US SCROTUM dated 10/08/2013; CT PELVIS W/CM dated 09/16/2013; CT ABD W/CM dated 02/10/2007  FINDINGS: There is stable mild atelectasis or scarring in both lung bases. No significant pleural or pericardial effusion is present. There is a small hiatal hernia.  Hepatic steatosis appears mildly improved. There is no focal hepatic abnormality. The spleen, gallbladder, pancreas, adrenal  glands and kidneys appear normal. There is no hydronephrosis.  The stomach and small bowel appear normal without significant distention or wall thickening. The colon is diffusely fluid filled, but shows no wall thickening or surrounding inflammation. The appendix appears normal.  Foley catheter has been removed. There is bladder wall thickening with mild residual perivesical soft tissue stranding. 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 Fluid collections posterior to both external iliac arteries also appear slightly smaller and better defined, measuring 3.3 x 2.1 cm on the right  and 3.4 x 1.6 cm on the left. There is minimal residual air within the right-sided collection. There is no evidence of iliac vein DVT. Edematous changes within the perineum and upper scrotum appear improved compared with the recent study.  IMPRESSION: 1. Evolving postoperative pelvic fluid collections, likely seromas or lymphoceles. The perineal and perivesical soft tissue stranding noted previously have improved. 2. Fluid-filled colon, likely ileus. No evidence of bowel obstruction or new extraluminal fluid collection. 3. No acute abdominal findings.  Mild hepatic steatosis.   Electronically Signed   By: Camie Patience M.D.   On: 10/08/2013 18:09   [place] Art/ven Flow Abd Pelv Doppler  10/08/2013   CLINICAL DATA:  Bilateral testicles swelling, rule out torsion  EXAM: SCROTAL ULTRASOUND  DOPPLER ULTRASOUND OF THE TESTICLES  TECHNIQUE: Complete ultrasound examination of the testicles, epididymis, and other scrotal structures was performed. Color and spectral Doppler ultrasound were also utilized to evaluate blood flow to the testicles.  COMPARISON:  None.  FINDINGS: Right testicle  Measurements: 47 x 19 x 24 mm. No mass or microlithiasis visualized.  Left testicle  Measurements: 40 x 23 x  16 mm. No mass or microlithiasis visualized.  Right epididymis:  Normal in size and appearance.  Left epididymis:  Mildly enlarged and hypervascular  Hydrocele: Tiny right hydrocele. Large complex left hydrocele with multiple septations.  Varicocele:  None visualized.  Pulsed Doppler interrogation of both testes demonstrates increased blood flow to both testicles.  IMPRESSION: Findings consistent with bilateral orchitis and left epididymitis, with large complex hydrocele left scrotum.   Electronically Signed   By: Skipper Cliche M.D.   On: 10/08/2013 17:37    Microbiology: Recent Results (from the past 240 hour(s))  CULTURE, BLOOD (ROUTINE X 2)     Status: None   Collection Time    10/08/13  3:50 PM      Result Value Ref Range Status   Specimen Description BLOOD LEFT HAND  2 ML IN [facility] BOTTLE   Final   Special Requests Immunocompromised   Final   Culture  Setup Time     Final   Value: 10/08/2013 20:56     Performed at [facility]   Culture     Final   Value:        BLOOD CULTURE RECEIVED NO GROWTH TO DATE CULTURE WILL BE HELD FOR 5 DAYS BEFORE ISSUING A FINAL NEGATIVE REPORT     Performed at [facility]   Report Status PENDING   Incomplete  CULTURE, BLOOD (ROUTINE X 2)     Status: None   Collection Time    10/08/13  3:55 PM      Result Value Ref Range Status   Specimen Description BLOOD LEFT ANTECUBITAL   Final   Special Requests BOTTLES DRAWN AEROBIC AND ANAEROBIC 5CC   Final   Culture  Setup Time     Final   Value: 10/08/2013 20:56     Performed at [facility]   Culture     Final   Value:        BLOOD CULTURE RECEIVED NO GROWTH TO DATE CULTURE WILL BE HELD FOR 5 DAYS BEFORE ISSUING A FINAL NEGATIVE REPORT     Performed at [facility]   Report Status PENDING   Incomplete  URINE CULTURE     Status: None   Collection Time    10/08/13  6:02 PM      Result Value Ref Range Status   Specimen Description  URINE, CLEAN CATCH   Final   Special Requests  Normal   Final   Culture  Setup Time     Final   Value: 10/09/2013 03:35     Performed at [facility]     Final   Value: >=100,000 COLONIES/ML     Performed at [facility]   Culture     Final   Value: PSEUDOMONAS AERUGINOSA     Performed at [facility]   Report Status 10/10/2013 FINAL   Final   Organism ID, Bacteria PSEUDOMONAS AERUGINOSA   Final  MRSA PCR SCREENING     Status: None   Collection Time    10/08/13  9:03 PM      Result Value Ref Range Status   MRSA by PCR NEGATIVE  NEGATIVE Final   Comment:            The GeneXpert MRSA Assay (FDA     approved for NASAL specimens     only), is one component of a     comprehensive MRSA colonization     surveillance program. It is not     intended to diagnose MRSA     infection nor to guide or     monitor treatment for     MRSA infections.     Labs: Basic Metabolic Panel:  Recent Labs Lab 10/08/13 1530 10/09/13 0320 10/10/13 0340 10/11/13 0315  NA 135* 137 132* 135*  K 4.6 4.3 4.2 4.7  CL 95* 101 98 100  CO2 27 26 25 23   GLUCOSE 126* 102* 87 89  BUN 12 11 8 7   CREATININE 1.05 0.96 0.96 0.85  CALCIUM 9.7 8.7 8.6 8.7   Liver Function Tests:  Recent Labs Lab 10/08/13 1530  AST 41*  ALT 43  ALKPHOS 99  BILITOT 0.7  PROT 8.8*  ALBUMIN 3.5   No results found for this basename: LIPASE, AMYLASE,  in the last 168 hours No results found for this basename: AMMONIA,  in the last 168 hours CBC:  Recent Labs Lab 10/08/13 1530 10/09/13 0320 10/10/13 0340 10/11/13 0315  WBC 20.5* 18.5* 12.9* 8.0  NEUTROABS 17.6*  --   --   --   HGB 14.2 12.4* 11.6* 11.6*  HCT 42.4 38.6* 36.0* 36.5*  MCV 97.7 99.5 98.4 98.4  PLT 397 313 318 321   Cardiac Enzymes: No results found for this basename: CKTOTAL, CKMB, CKMBINDEX, TROPONINI,  in the last 168 hours BNP: BNP (last 3 results) No results found for this basename: PROBNP,  in the last 8760 hours CBG: No results found for this  basename: GLUCAP,  in the last 168 hours     Signed:  [facility] 10/11/2013, 4:07 PM

## 2013-10-11 NOTE — Discharge Instructions (Signed)
Epididymitis Epididymitis is a swelling (inflammation) of the epididymis. The epididymis is a cord-like structure along the back part of the testicle. Epididymitis is usually, but not always, caused by infection. This is usually a sudden problem beginning with chills, fever and pain behind the scrotum and in the testicle. There may be swelling and redness of the testicle. DIAGNOSIS  Physical examination will reveal a tender, swollen epididymis. Sometimes, cultures are obtained from the urine or from prostate secretions to help find out if there is an infection or if the cause is a different problem. Sometimes, blood work is performed to see if your white blood cell count is elevated and if a germ (bacterial) or viral infection is present. Using this knowledge, an appropriate medicine which kills germs (antibiotic) can be chosen by your caregiver. A viral infection causing epididymitis will most often go away (resolve) without treatment. HOME CARE INSTRUCTIONS   Hot sitz baths for 20 minutes, 4 times per day, may help relieve pain.  Only take over-the-counter or prescription medicines for pain, discomfort or fever as directed by your caregiver.  Take all medicines, including antibiotics, as directed. Take the antibiotics for the full prescribed length of time even if you are feeling better.  It is very important to keep all follow-up appointments. SEEK IMMEDIATE MEDICAL CARE IF:   You have a fever.  You have pain not relieved with medicines.  You have any worsening of your problems.  Your pain seems to come and go.  You develop pain, redness, and swelling in the scrotum and surrounding areas. MAKE SURE YOU:   Understand these instructions.  Will watch your condition.  Will get help right away if you are not doing well or get worse. Document Released: 06/12/2000 Document Revised: 09/07/2011 Document Reviewed: 05/02/2009 [facility] Patient Information 2014 [place],  [place]. Orchitis Orchitis is an infection of the testicle of usually sudden onset (happens quickly). It may be viral or bacterial (caused by germs). Usually with this illness there is generalized malaise (not feeling well) and fever. There is also pain. There is usually tenderness and swelling of the scrotum and testicle. DIAGNOSIS  Your caregiver will perform an exam to make sure there is not another reason for the pain in your testicle. A rectal exam may be done to find out if the prostate is swollen and tender. Blood work may be done to see if your white blood cell count is elevated. This can help determine if an infection is viral or bacterial. A urinalysis can also determine what type of infection is present. Most bacterial infections can be treated with antibiotics (medications which kill germs). LET YOUR CAREGIVER KNOW ABOUT:  Allergies.  Medications taken including herbs, eye drops, over the counter medications, and creams.  Use of steroids (by mouth or creams).  Previous problems with anesthetics or novocaine.  Previous prostate infections.  History of blood clots (thrombophlebitis).  History of bleeding or blood problems.  Previous surgery.  Previous urinary tract infection.  Other health problems. HOME CARE INSTRUCTIONS   Apply cold packs to the scrotal area for twenty minutes, four times per day or as needed.  A scrotal support may be helpful. Keep a small pillow or support under your testicles while lying or sitting down.  Only take over-the-counter or prescription medicines for pain, discomfort, or fever as directed by your caregiver.  Take all medications, including antibiotics, as directed. Take the antibiotics for the full prescribed length of time even if you are feeling better. SEEK  IMMEDIATE MEDICAL CARE IF:   Your redness, swelling, or pain in the testicle increases or is not getting better.  You have a fever.  You have pain not relieved with  medicines.  You have any worsening of any symptoms (problems) that originally brought you in for medical care. Document Released: 06/12/2000 Document Revised: 09/07/2011 Document Reviewed: 06/15/2005 [facility] Patient Information 2014 [place].

## 2013-10-13 ENCOUNTER — Encounter: Payer: 59 | Admitting: Internal Medicine

## 2013-10-14 LAB — CULTURE, BLOOD (ROUTINE X 2)
CULTURE: NO GROWTH
Culture: NO GROWTH

## 2013-10-31 ENCOUNTER — Other Ambulatory Visit (INDEPENDENT_AMBULATORY_CARE_PROVIDER_SITE_OTHER): Payer: 59

## 2013-10-31 DIAGNOSIS — Z0000 Encounter for general adult medical examination without abnormal findings: Secondary | ICD-10-CM

## 2013-10-31 LAB — HEPATIC FUNCTION PANEL
ALT: 30 U/L (ref 0–53)
AST: 24 U/L (ref 0–37)
Albumin: 3.6 g/dL (ref 3.5–5.2)
Alkaline Phosphatase: 66 U/L (ref 39–117)
BILIRUBIN DIRECT: 0.1 mg/dL (ref 0.0–0.3)
TOTAL PROTEIN: 7.8 g/dL (ref 6.0–8.3)
Total Bilirubin: 0.1 mg/dL — ABNORMAL LOW (ref 0.2–1.2)

## 2013-10-31 LAB — BASIC METABOLIC PANEL
BUN: 15 mg/dL (ref 6–23)
CHLORIDE: 104 meq/L (ref 96–112)
CO2: 27 meq/L (ref 19–32)
Calcium: 9.9 mg/dL (ref 8.4–10.5)
Creatinine, Ser: 1.3 mg/dL (ref 0.4–1.5)
GFR: 61.89 mL/min (ref >60.00)
GLUCOSE: 96 mg/dL (ref 70–99)
Potassium: 5.3 mEq/L — ABNORMAL HIGH (ref 3.5–5.1)
Sodium: 138 mEq/L (ref 135–145)

## 2013-10-31 LAB — LIPID PANEL
Cholesterol: 205 mg/dL — ABNORMAL HIGH (ref 0–200)
HDL: 42 mg/dL (ref >39.00)
LDL Cholesterol: 141 mg/dL — ABNORMAL HIGH (ref 0–99)
Total CHOL/HDL Ratio: 5
Triglycerides: 112 mg/dL (ref 0.0–149.0)
VLDL: 22.4 mg/dL (ref 0.0–40.0)

## 2013-10-31 LAB — CBC WITH DIFFERENTIAL/PLATELET
BASOS PCT: 0.8 % (ref 0.0–3.0)
Basophils Absolute: 0.1 10*3/uL (ref 0.0–0.1)
EOS ABS: 0.3 10*3/uL (ref 0.0–0.7)
Eosinophils Relative: 5.3 % — ABNORMAL HIGH (ref 0.0–5.0)
HEMATOCRIT: 44.9 % (ref 39.0–52.0)
Hemoglobin: 14.7 g/dL (ref 13.0–17.0)
LYMPHS ABS: 2.8 10*3/uL (ref 0.7–4.0)
Lymphocytes Relative: 43.1 % (ref 12.0–46.0)
MCHC: 32.7 g/dL (ref 30.0–36.0)
MCV: 96.7 fl (ref 78.0–100.0)
MONO ABS: 0.6 10*3/uL (ref 0.1–1.0)
Monocytes Relative: 9 % (ref 3.0–12.0)
Neutro Abs: 2.7 10*3/uL (ref 1.4–7.7)
Neutrophils Relative %: 41.8 % — ABNORMAL LOW (ref 43.0–77.0)
PLATELETS: 481 10*3/uL — AB (ref 150.0–400.0)
RBC: 4.64 Mil/uL (ref 4.22–5.81)
RDW: 15.2 % — ABNORMAL HIGH (ref 11.5–14.6)
WBC: 6.5 10*3/uL (ref 4.5–10.5)

## 2013-10-31 LAB — POCT URINALYSIS DIPSTICK
Bilirubin, UA: NEGATIVE
Glucose, UA: NEGATIVE
KETONES UA: NEGATIVE
Nitrite, UA: NEGATIVE
PH UA: 5.5
Spec Grav, UA: >=1.03
Urobilinogen, UA: 0.2

## 2013-10-31 LAB — PSA: PSA: 0.01 ng/mL — AB (ref 0.10–4.00)

## 2013-10-31 LAB — TSH: TSH: 0.45 u[IU]/mL (ref 0.35–4.50)

## 2013-11-06 ENCOUNTER — Ambulatory Visit (INDEPENDENT_AMBULATORY_CARE_PROVIDER_SITE_OTHER): Payer: 59 | Admitting: Internal Medicine

## 2013-11-06 ENCOUNTER — Encounter: Payer: Self-pay | Admitting: Internal Medicine

## 2013-11-06 VITALS — BP 130/84 | HR 83 | Temp 98.7°F | Resp 20 | Ht 70.75 in | Wt 205.0 lb

## 2013-11-06 DIAGNOSIS — Z0000 Encounter for general adult medical examination without abnormal findings: Secondary | ICD-10-CM

## 2013-11-06 DIAGNOSIS — J45909 Unspecified asthma, uncomplicated: Secondary | ICD-10-CM

## 2013-11-06 DIAGNOSIS — M1990 Unspecified osteoarthritis, unspecified site: Secondary | ICD-10-CM

## 2013-11-06 DIAGNOSIS — R972 Elevated prostate specific antigen [PSA]: Secondary | ICD-10-CM

## 2013-11-06 MED ORDER — TRAMADOL HCL 50 MG PO TABS: 50.0000 mg | ORAL_TABLET | Freq: Four times a day (QID) | ORAL | Status: DC | PRN

## 2013-11-06 NOTE — Patient Instructions (Signed)
Followup urology Take pain medications as prescribed

## 2013-11-06 NOTE — Progress Notes (Signed)
Subjective:    Patient ID: [name], male    DOB: August 10, 1950, 63 y.o.   MRN: [id number]  HPI  Admit date: 10/08/2013  Discharge date: 10/11/2013   Recommendations for Outpatient Follow-up:  1. New medication: Cipro 500 mg by mouth twice a day x5 days for total of 10 days of therapy 2. New medication: OxyIR 5 mg every 4 hours when necessary for pain. (Patient will be advised to discontinue previous Percocet) 3. New medication: Phenergan 12.5 mg every 6 hours when necessary for nausea 4. Patient will followup with urology at [facility], Dr.Hemal in the next 1-2 weeks 5.  Discharge Diagnoses:  Principal Problem:  Sepsis  Active Problems:  UTI (lower urinary tract infection)  Pyelonephritis  Scrotal edema  Abdominal pain  Ileus  Orchitis and epididymitis   Discharge Condition: Improved, being discharged home  Diet recommendation: Regular   Filed Weights    10/08/13 1929  10/09/13 2048   Weight:  90.719 kg (200 lb)  100.5 kg (221 lb 9 oz)    History of present illness:   62 year old African American male with past medical history of recently diagnosed prostate cancer status post radical prostatectomy and bilateral pelvic lymphadenectomy done 3/18 by urology at [facility]. Patient started having pain in left flank plus scrotal swelling and edema starting 2 days prior.in the emergency room, he was noted to have a white blood cell count 20.5, lactic acid level of 1.5 and CT abdomen and pelvis noted evolving postoperative pelvic fluid collections plus urinalysis positive for UTI. Patient admitted for sepsis, bleeding criteria given tachycardia, hypotension and leukocytosis. This was initially felt to be secondary to UTI to the hospitalist service. Urology was consulted.   Hospital Course:   Principal Problem:  Sepsis: Initially thought to be secondary to UTI. Urology evaluated patient with worsening scrotal pain by hospital day 2, felt more likely this was related to  orchitis/left epididymitis, confirmed by ultrasound. Urine culture sent and patient started on IV Rocephin. Blood cultures remained negative. Patient was continued on IV antibiotics until urine positive for Pseudomonas pansensitive. At this 0.4/15, he had remained afebrile and white blood cell count had normalized. At this point, he was felt to be safe for discharge home on one week of Cipro.  Active Problems:  UTI (lower urinary tract infection): Secondary to orchitis. See above  Scrotal edema: Secondary to orchitis and epididymitis. Improving.  Abdominal pain : Referred pain secondary to orchitis and epididymitis. Better.  63 year old patient who is seen today for a preventive health examination.  He has had a recent hospitalization for septic complications following a recent prostatectomy for prostate cancer.  He has been treated with Augmentin, Cipro, in approximately 10 days ago, was placed on Septra DS by urology.  For the past 4 days, he has developed left lower quadrant and lower mid abdominal pain.  No fever or other constitutional complaints  Past Medical History  Diagnosis Date  . ALLERGIC RHINITIS 04/19/2009  . ASTHMA 04/19/2009  . COLONIC POLYPS, HX OF 04/19/2009  . OSTEOARTHRITIS 04/19/2009  . Eczema     History   Social History  . Marital Status: Divorced    Spouse Name: N/A    Number of Children: N/A  . Years of Education: N/A   Occupational History  . Not on file.   Social History Main Topics  . Smoking status: Former Smoker -- 1.00 packs/day    Quit date: 07/03/2011  . Smokeless tobacco: Never Used  . Alcohol  Use: No  . Drug Use: No  . Sexual Activity: Not on file   Other Topics Concern  . Not on file   Social History Narrative  . No narrative on file    Past Surgical History  Procedure Laterality Date  . Joint replacement      left hip  . Prostatectomy      September 14, 2013    No family history on file.  Allergies  Allergen Reactions  .  Horse-Derived Products Anaphylaxis  . Meperidine Nausea And Vomiting    "patient states that he felt like he was blacking out and couldn't wake up.    Current Outpatient Prescriptions on File Prior to Visit  Medication Sig Dispense Refill  . albuterol (PROAIR HFA) 108 (90 BASE) MCG/ACT inhaler Inhale 2 puffs into the lungs every 6 (six) hours as needed for wheezing.  17 g  5  . ibuprofen (ADVIL,MOTRIN) 200 MG tablet Take 400 mg by mouth every 6 (six) hours as needed for moderate pain.      . Multiple Vitamin (MULTIVITAMIN WITH MINERALS) TABS tablet Take 1 tablet by mouth daily.      . Omega-3 Fatty Acids (FISH OIL) 1000 MG CAPS Take 2 capsules by mouth daily.      Marland Kitchen senna-docusate (SENNA PLUS) 8.6-50 MG per tablet Take 1 tablet by mouth daily as needed for mild constipation (constipation).      . triamcinolone cream (KENALOG) 0.1 % Apply 1 application topically 2 (two) times daily as needed.      . vitamin C (ASCORBIC ACID) 500 MG tablet Take 500 mg by mouth daily.       No current facility-administered medications on file prior to visit.    BP 130/84  Pulse 83  Temp(Src) 98.7 F (37.1 C) (Oral)  Resp 20  Ht 5' 10.75" (1.797 m)  Wt 205 lb (92.987 kg)  BMI 28.80 kg/m2  SpO2 98%   Family history.  Father died at 69 of complications of coronary artery disease.  Had diabetes Mother is in her eighties and in excellent health 2 brothers 2 sisters in excellent health except for one brother with AIDS   Review of Systems  Constitutional: Negative for fever, chills, activity change, appetite change and fatigue.  HENT: Negative for congestion, dental problem, ear pain, hearing loss, mouth sores, rhinorrhea, sinus pressure, sneezing, tinnitus, trouble swallowing and voice change.   Eyes: Negative for photophobia, pain, redness and visual disturbance.  Respiratory: Negative for apnea, cough, choking, chest tightness, shortness of breath and wheezing.   Cardiovascular: Negative for chest  pain, palpitations and leg swelling.  Gastrointestinal: Positive for abdominal pain. Negative for nausea, vomiting, diarrhea, constipation, blood in stool, abdominal distention, anal bleeding and rectal pain.  Genitourinary: Negative for dysuria, urgency, frequency, hematuria, flank pain, decreased urine volume, discharge, penile swelling, scrotal swelling, difficulty urinating, genital sores and testicular pain.  Musculoskeletal: Negative for arthralgias, back pain, gait problem, joint swelling, myalgias, neck pain and neck stiffness.  Skin: Negative for color change, rash and wound.  Neurological: Negative for dizziness, tremors, seizures, syncope, facial asymmetry, speech difficulty, weakness, light-headedness, numbness and headaches.  Hematological: Negative for adenopathy. Does not bruise/bleed easily.  Psychiatric/Behavioral: Negative for suicidal ideas, hallucinations, behavioral problems, confusion, sleep disturbance, self-injury, dysphoric mood, decreased concentration and agitation. The patient is not nervous/anxious.        Objective:   Physical Exam  Constitutional: He appears well-developed and well-nourished.  HENT:  Head: Normocephalic and atraumatic.  Right Ear:  External ear normal.  Left Ear: External ear normal.  Nose: Nose normal.  Mouth/Throat: Oropharynx is clear and moist.  Eyes: Conjunctivae and EOM are normal. Pupils are equal, round, and reactive to light. No scleral icterus.  Neck: Normal range of motion. Neck supple. No JVD present. No thyromegaly present.  Cardiovascular: Regular rhythm, normal heart sounds and intact distal pulses.  Exam reveals no gallop and no friction rub.   No murmur heard. Pulmonary/Chest: Effort normal and breath sounds normal. He exhibits no tenderness.  Abdominal: Soft. Bowel sounds are normal. He exhibits no distension and no mass. There is tenderness. There is guarding.  Considerable tenderness in the left lower quadrant with mild  guarding Active bowel sounds No distention  Genitourinary: Penis normal.  Left testicle still slightly swollen  Musculoskeletal: Normal range of motion. He exhibits no edema and no tenderness.  Lymphadenopathy:    He has no cervical adenopathy.  Neurological: He is alert. He has normal reflexes. No cranial nerve deficit. Coordination normal.  Skin: Skin is warm and dry. No rash noted.  Psychiatric: He has a normal mood and affect. His behavior is normal.          Assessment & Plan:   Preventive health examination Recurrent left lower quadrant pain.  Followup urology.  Will likely require repeat CT of the abdomen and pelvis Asthma stable Status post prostatectomy History of colonic polyps Status post urosepsis Status post orchitis and epididymitis

## 2013-11-06 NOTE — Progress Notes (Signed)
Pre-visit discussion using our clinic review tool. No additional management support is needed unless otherwise documented below in the visit note.  

## 2013-11-09 ENCOUNTER — Other Ambulatory Visit: Payer: Self-pay | Admitting: Internal Medicine

## 2013-12-04 ENCOUNTER — Encounter: Payer: Self-pay | Admitting: Internal Medicine

## 2013-12-04 ENCOUNTER — Ambulatory Visit (INDEPENDENT_AMBULATORY_CARE_PROVIDER_SITE_OTHER): Payer: 59 | Admitting: Internal Medicine

## 2013-12-04 VITALS — BP 156/90 | HR 113 | Temp 98.0°F | Resp 20 | Ht 70.75 in | Wt 205.0 lb

## 2013-12-04 DIAGNOSIS — F10939 Alcohol use, unspecified with withdrawal, unspecified: Secondary | ICD-10-CM

## 2013-12-04 DIAGNOSIS — J45909 Unspecified asthma, uncomplicated: Secondary | ICD-10-CM

## 2013-12-04 DIAGNOSIS — M1990 Unspecified osteoarthritis, unspecified site: Secondary | ICD-10-CM

## 2013-12-04 DIAGNOSIS — F10239 Alcohol dependence with withdrawal, unspecified: Secondary | ICD-10-CM

## 2013-12-04 DIAGNOSIS — R109 Unspecified abdominal pain: Secondary | ICD-10-CM

## 2013-12-04 DIAGNOSIS — J309 Allergic rhinitis, unspecified: Secondary | ICD-10-CM

## 2013-12-04 DIAGNOSIS — F1020 Alcohol dependence, uncomplicated: Secondary | ICD-10-CM

## 2013-12-04 MED ORDER — CLONAZEPAM 0.5 MG PO TABS: 0.5000 mg | ORAL_TABLET | Freq: Three times a day (TID) | ORAL | Status: DC | PRN

## 2013-12-04 MED ORDER — VITAMIN B-1 100 MG PO TABS: 100.0000 mg | ORAL_TABLET | Freq: Every day | ORAL | Status: DC

## 2013-12-04 NOTE — Progress Notes (Signed)
Pre-visit discussion using our clinic review tool. No additional management support is needed unless otherwise documented below in the visit note.  

## 2013-12-04 NOTE — Progress Notes (Signed)
Subjective:    Patient ID: [name], male    DOB: April 21, 1951, 63 y.o.   MRN: [id number]  HPI  63 year old patient who is seen today with a chief complaint of a drinking problem.  He has a remote history of alcoholism and had been abstinent for about 7 years.  He has been involved with AA, locally, and does have a sponsor.  He, himself has been a sponsor and is aware of all the drug rehabilitation facilities locally. He again started drinking about 3 weeks ago at times quite heavily, up to 2 fifths daily.  He has had some withdrawal symptoms when he attempts to discontinue.  He continues to be employed.  He states that he has recontacted his sponsor and will resume AA meetings  Past Medical History  Diagnosis Date  . ALLERGIC RHINITIS 04/19/2009  . ASTHMA 04/19/2009  . COLONIC POLYPS, HX OF 04/19/2009  . OSTEOARTHRITIS 04/19/2009  . Eczema     History   Social History  . Marital Status: Divorced    Spouse Name: N/A    Number of Children: N/A  . Years of Education: N/A   Occupational History  . Not on file.   Social History Main Topics  . Smoking status: Former Smoker -- 1.00 packs/day    Quit date: 07/03/2011  . Smokeless tobacco: Never Used  . Alcohol Use: No  . Drug Use: No  . Sexual Activity: Not on file   Other Topics Concern  . Not on file   Social History Narrative  . No narrative on file    Past Surgical History  Procedure Laterality Date  . Joint replacement      left hip  . Prostatectomy      September 14, 2013    No family history on file.  Allergies  Allergen Reactions  . Horse-Derived Products Anaphylaxis  . Meperidine Nausea And Vomiting    "patient states that he felt like he was blacking out and couldn't wake up.    Current Outpatient Prescriptions on File Prior to Visit  Medication Sig Dispense Refill  . albuterol (PROAIR HFA) 108 (90 BASE) MCG/ACT inhaler Inhale 2 puffs into the lungs every 6 (six) hours as needed for wheezing.  17 g  5    . hydrOXYzine (ATARAX/VISTARIL) 25 MG tablet TAKE AS DIRECTED AS NEEDED FOR ITCHING  60 tablet  1  . ibuprofen (ADVIL,MOTRIN) 200 MG tablet Take 400 mg by mouth every 6 (six) hours as needed for moderate pain.      . Multiple Vitamin (MULTIVITAMIN WITH MINERALS) TABS tablet Take 1 tablet by mouth daily.      . Omega-3 Fatty Acids (FISH OIL) 1000 MG CAPS Take 2 capsules by mouth daily.      . tadalafil (CIALIS) 5 MG tablet Take 5 mg by mouth. Take 1 tablet 5 days per week      . triamcinolone cream (KENALOG) 0.1 % Apply 1 application topically 2 (two) times daily as needed.      . vitamin C (ASCORBIC ACID) 500 MG tablet Take 500 mg by mouth daily.       No current facility-administered medications on file prior to visit.    BP 156/90  Pulse 113  Temp(Src) 98 F (36.7 C) (Oral)  Resp 20  Ht 5' 10.75" (1.797 m)  Wt 205 lb (92.987 kg)  BMI 28.80 kg/m2  SpO2 98%        Review of Systems  Constitutional: Positive for fatigue. Negative  for fever, chills and appetite change.  HENT: Negative for congestion, dental problem, ear pain, hearing loss, sore throat, tinnitus, trouble swallowing and voice change.   Eyes: Negative for pain, discharge and visual disturbance.  Respiratory: Negative for cough, chest tightness, wheezing and stridor.   Cardiovascular: Negative for chest pain, palpitations and leg swelling.  Gastrointestinal: Negative for nausea, vomiting, abdominal pain, diarrhea, constipation, blood in stool and abdominal distention.  Genitourinary: Negative for urgency, hematuria, flank pain, discharge, difficulty urinating and genital sores.  Musculoskeletal: Negative for arthralgias, back pain, gait problem, joint swelling, myalgias and neck stiffness.  Skin: Negative for rash.  Neurological: Positive for tremors and weakness. Negative for dizziness, syncope, speech difficulty, numbness and headaches.  Hematological: Negative for adenopathy. Does not bruise/bleed easily.   Psychiatric/Behavioral: Positive for hallucinations and sleep disturbance. Negative for behavioral problems and dysphoric mood. The patient is nervous/anxious.        Objective:   Physical Exam  Constitutional: He is oriented to person, place, and time. He appears well-developed.  HENT:  Head: Normocephalic.  Right Ear: External ear normal.  Left Ear: External ear normal.  Conjunctiva appear hyperemic, but not icteric  Eyes: Conjunctivae and EOM are normal.  Neck: Normal range of motion.  Cardiovascular: Regular rhythm and normal heart sounds.   Mild resting tachycardia  Pulmonary/Chest: Breath sounds normal.  Abdominal: Bowel sounds are normal. He exhibits no distension. There is no tenderness. There is no rebound.  Musculoskeletal: Normal range of motion. He exhibits no edema and no tenderness.  Neurological: He is alert and oriented to person, place, and time.  No tremor  Psychiatric: He has a normal mood and affect. His behavior is normal.          Assessment & Plan:   Alcoholism with mild withdrawal syndrome.  We'll place on limited Klonopin.  He is aware of all local rehabilitation facilities and is agreeable to inpatient or outpatient therapy.  He does have a sponsor who he will contact and followup with AA.  He has been asked return in one week for followup.  He'll be placed on thiamine and multivitamins Osteoarthritis History of asthma, stable

## 2013-12-04 NOTE — Patient Instructions (Addendum)
Please follow up with the [facility] or the [facility]   Consider [facility] Take medications as directed Followup with AA and your sponsor  Return here in one week for followup  Multivitamin and thiamine dailyFinding Treatment for Alcohol and Drug Addiction It can be hard to find the right place to get professional treatment. Here are some important things to consider:  There are different types of treatment to choose from.  Some programs are live-in (residential) while others are not (outpatient). Sometimes a combination is offered.  No single type of program is right for everyone.  Most treatment programs involve a combination of education, counseling, and a 12-step, spiritually-based approach.  There are non-spiritually based programs (not 12-step).  Some treatment programs are government sponsored. They are geared for patients without private insurance.  Treatment programs can vary in many respects such as:  Cost and types of insurance accepted.  Types of on-site medical services offered.  Length of stay, setting, and size.  Overall philosophy of treatment. A person may need specialized treatment or have needs not addressed by all programs. For example, adolescents need treatment appropriate for their age. Other people have secondary disorders that must be managed as well. Secondary conditions can include mental illness, such as depression or diabetes. Often, a period of detoxification from alcohol or drugs is needed. This requires medical supervision and not all programs offer this. THINGS TO CONSIDER WHEN SELECTING A TREATMENT PROGRAM   Is the program certified by the appropriate government agency? Even private programs must be certified and employ certified professionals.  Does the program accept your insurance? If not, can a payment plan be set up?  Is the facility clean, organized, and well run? Do they allow you to speak with graduates who  can share their treatment experience with you? Can you tour the facility? Can you meet with staff?  Does the program meet the full range of individual needs?  Does the treatment program address sexual orientation and physical disabilities? Do they provide age, gender, and culturally appropriate treatment services?  Is treatment available in languages other than English?  Is long-term aftercare support or guidance encouraged and provided?  Is assessment of an individual's treatment plan ongoing to ensure it meets changing needs?  Does the program use strategies to encourage reluctant patients to remain in treatment long enough to increase the likelihood of success?  Does the program offer counseling (individual or group) and other behavioral therapies?  Does the program offer medicine as part of the treatment regimen, if needed?  Is there ongoing monitoring of possible relapse? Is there a defined relapse prevention program? Are services or referrals offered to family members to ensure they understand addiction and the recovery process? This would help them support the recovering individual.  Are 12-step meetings held at the center or is transport available for patients to attend outside meetings? In countries outside of the [place] and [place], [employer] for contact information for services in your area. Document Released: 05/14/2005 Document Revised: 09/07/2011 Document Reviewed: 11/24/2007 [facility] Patient Information 2014 [place]. Alcohol Withdrawal Anytime drug use is interfering with normal living activities it has become abuse. This includes problems with family and friends. Psychological dependence has developed when your mind tells you that the drug is needed. This is usually followed by physical dependence when a continuing increase of drugs are required to get the same feeling or "high." This is known as addiction or chemical dependency. A person's risk  is much  higher if there is a history of chemical dependency in the family. Mild Withdrawal Following Stopping Alcohol, When Addiction or Chemical Dependency Has Developed When a person has developed tolerance to alcohol, any sudden stopping of alcohol can cause uncomfortable physical symptoms. Most of the time these are mild and consist of tremors in the hands and increases in heart rate, breathing, and temperature. Sometimes these symptoms are associated with anxiety, panic attacks, and bad dreams. There may also be stomach upset. Normal sleep patterns are often interrupted with periods of inability to sleep (insomnia). This may last for 6 months. Because of this discomfort, many people choose to continue drinking to get rid of this discomfort and to try to feel normal. Severe Withdrawal with Decreased or No Alcohol Intake, When Addiction or Chemical Dependency Has Developed About five percent of alcoholics will develop signs of severe withdrawal when they stop using alcohol. One sign of this is development of generalized seizures (convulsions). Other signs of this are severe agitation and confusion. This may be associated with believing in things which are not real or seeing things which are not really there (delusions and hallucinations). Vitamin deficiencies are usually present if alcohol intake has been long-term. Treatment for this most often requires hospitalization and close observation. Addiction can only be helped by stopping use of all chemicals. This is hard but may save your life. With continual alcohol use, possible outcomes are usually loss of self respect and esteem, violence, and death. Addiction cannot be cured but it can be stopped. This often requires outside help and the care of professionals. Treatment centers are listed in the yellow pages under Cocaine, Narcotics, and Alcoholics Anonymous. Most hospitals and clinics can refer you to a specialized care center. It is not necessary for you to go  through the uncomfortable symptoms of withdrawal. Your caregiver can provide you with medicines that will help you through this difficult period. Try to avoid situations, friends, or drugs that made it possible for you to keep using alcohol in the past. Learn how to say no. It takes a long period of time to overcome addictions to all drugs, including alcohol. There may be many times when you feel as though you want a drink. After getting rid of the physical addiction and withdrawal, you will have a lessening of the craving which tells you that you need alcohol to feel normal. Call your caregiver if more support is needed. Learn who to talk to in your family and among your friends so that during these periods you can receive outside help. Alcoholics Anonymous (AA) has helped many people over the years. To get further help, contact AA or call your caregiver, counselor, or clergyperson. Al-Anon and Alateen are support groups for friends and family members of an alcoholic. The people who love and care for an alcoholic often need help, too. For information about these organizations, check your phone directory or call a local alcoholism treatment center.  SEEK IMMEDIATE MEDICAL CARE IF:   You have a seizure.  You have a fever.  You experience uncontrolled vomiting or you vomit up blood. This may be bright red or look like black coffee grounds.  You have blood in the stool. This may be bright red or appear as a black, tarry, bad-smelling stool.  You become lightheaded or faint. Do not drive if you feel this way. Have someone else drive you or call 176 for help.  You become more agitated or confused.  You develop  uncontrolled anxiety.  You begin to see things that are not really there (hallucinate). Your caregiver has determined that you completely understand your medical condition, and that your mental state is back to normal. You understand that you have been treated for alcohol withdrawal, have agreed  not to drink any alcohol for a minimum of 1 day, will not operate a car or other machinery for 24 hours, and have had an opportunity to ask any questions about your condition. Document Released: 03/25/2005 Document Revised: 09/07/2011 Document Reviewed: 02/01/2008 [facility] Patient Information 2014 [place].

## 2013-12-12 ENCOUNTER — Ambulatory Visit: Payer: 59 | Admitting: Internal Medicine

## 2013-12-19 ENCOUNTER — Ambulatory Visit: Payer: 59 | Admitting: Internal Medicine

## 2014-04-23 ENCOUNTER — Encounter (HOSPITAL_COMMUNITY): Payer: Self-pay | Admitting: Emergency Medicine

## 2014-04-23 ENCOUNTER — Emergency Department (HOSPITAL_COMMUNITY): Payer: 59

## 2014-04-23 ENCOUNTER — Inpatient Hospital Stay (HOSPITAL_COMMUNITY)
Admission: EM | Admit: 2014-04-23 | Discharge: 2014-04-26 | DRG: 897 | Disposition: A | Discharge status: Home / Self Care | Payer: 59 | Attending: Internal Medicine | Admitting: Internal Medicine

## 2014-04-23 DIAGNOSIS — R740 Nonspecific elevation of levels of transaminase and lactic acid dehydrogenase [LDH]: Secondary | ICD-10-CM

## 2014-04-23 DIAGNOSIS — D709 Neutropenia, unspecified: Secondary | ICD-10-CM | POA: Diagnosis present

## 2014-04-23 DIAGNOSIS — F10239 Alcohol dependence with withdrawal, unspecified: Secondary | ICD-10-CM

## 2014-04-23 DIAGNOSIS — Y907 Blood alcohol level of 200-239 mg/100 ml: Secondary | ICD-10-CM | POA: Diagnosis present

## 2014-04-23 DIAGNOSIS — F10230 Alcohol dependence with withdrawal, uncomplicated: Secondary | ICD-10-CM

## 2014-04-23 DIAGNOSIS — K567 Ileus, unspecified: Secondary | ICD-10-CM

## 2014-04-23 DIAGNOSIS — F10129 Alcohol abuse with intoxication, unspecified: Secondary | ICD-10-CM | POA: Diagnosis present

## 2014-04-23 DIAGNOSIS — R030 Elevated blood-pressure reading, without diagnosis of hypertension: Secondary | ICD-10-CM

## 2014-04-23 DIAGNOSIS — F10930 Alcohol use, unspecified with withdrawal, uncomplicated: Secondary | ICD-10-CM

## 2014-04-23 DIAGNOSIS — F1020 Alcohol dependence, uncomplicated: Secondary | ICD-10-CM

## 2014-04-23 DIAGNOSIS — Z96642 Presence of left artificial hip joint: Secondary | ICD-10-CM | POA: Diagnosis present

## 2014-04-23 DIAGNOSIS — R1084 Generalized abdominal pain: Secondary | ICD-10-CM

## 2014-04-23 DIAGNOSIS — R079 Chest pain, unspecified: Secondary | ICD-10-CM | POA: Diagnosis present

## 2014-04-23 DIAGNOSIS — Z79899 Other long term (current) drug therapy: Secondary | ICD-10-CM | POA: Diagnosis not present

## 2014-04-23 DIAGNOSIS — F17210 Nicotine dependence, cigarettes, uncomplicated: Secondary | ICD-10-CM | POA: Diagnosis present

## 2014-04-23 DIAGNOSIS — N390 Urinary tract infection, site not specified: Secondary | ICD-10-CM

## 2014-04-23 DIAGNOSIS — A419 Sepsis, unspecified organism: Secondary | ICD-10-CM

## 2014-04-23 DIAGNOSIS — K219 Gastro-esophageal reflux disease without esophagitis: Secondary | ICD-10-CM | POA: Diagnosis present

## 2014-04-23 DIAGNOSIS — F17200 Nicotine dependence, unspecified, uncomplicated: Secondary | ICD-10-CM | POA: Diagnosis present

## 2014-04-23 DIAGNOSIS — R7401 Elevation of levels of liver transaminase levels: Secondary | ICD-10-CM | POA: Diagnosis present

## 2014-04-23 DIAGNOSIS — R1011 Right upper quadrant pain: Secondary | ICD-10-CM

## 2014-04-23 DIAGNOSIS — N453 Epididymo-orchitis: Secondary | ICD-10-CM

## 2014-04-23 DIAGNOSIS — M1990 Unspecified osteoarthritis, unspecified site: Secondary | ICD-10-CM | POA: Diagnosis present

## 2014-04-23 DIAGNOSIS — N12 Tubulo-interstitial nephritis, not specified as acute or chronic: Secondary | ICD-10-CM

## 2014-04-23 DIAGNOSIS — IMO0001 Reserved for inherently not codable concepts without codable children: Secondary | ICD-10-CM | POA: Diagnosis present

## 2014-04-23 DIAGNOSIS — R0789 Other chest pain: Secondary | ICD-10-CM | POA: Diagnosis not present

## 2014-04-23 DIAGNOSIS — K7010 Alcoholic hepatitis without ascites: Secondary | ICD-10-CM | POA: Diagnosis present

## 2014-04-23 DIAGNOSIS — J45909 Unspecified asthma, uncomplicated: Secondary | ICD-10-CM | POA: Diagnosis present

## 2014-04-23 DIAGNOSIS — F10939 Alcohol use, unspecified with withdrawal, unspecified: Secondary | ICD-10-CM | POA: Diagnosis present

## 2014-04-23 DIAGNOSIS — K2920 Alcoholic gastritis without bleeding: Secondary | ICD-10-CM | POA: Diagnosis present

## 2014-04-23 DIAGNOSIS — R972 Elevated prostate specific antigen [PSA]: Secondary | ICD-10-CM

## 2014-04-23 DIAGNOSIS — N5089 Other specified disorders of the male genital organs: Secondary | ICD-10-CM

## 2014-04-23 LAB — RAPID URINE DRUG SCREEN, HOSP PERFORMED
AMPHETAMINES: NOT DETECTED
BARBITURATES: NOT DETECTED
BENZODIAZEPINES: NOT DETECTED
Cocaine: NOT DETECTED
Opiates: NOT DETECTED
Tetrahydrocannabinol: NOT DETECTED

## 2014-04-23 LAB — CBC WITH DIFFERENTIAL/PLATELET
BASOS ABS: 0.1 10*3/uL (ref 0.0–0.1)
BASOS PCT: 2 % — AB (ref 0–1)
Eosinophils Absolute: 0.1 10*3/uL (ref 0.0–0.7)
Eosinophils Relative: 2 % (ref 0–5)
HCT: 48.9 % (ref 39.0–52.0)
HEMOGLOBIN: 16.6 g/dL (ref 13.0–17.0)
Lymphocytes Relative: 50 % — ABNORMAL HIGH (ref 12–46)
Lymphs Abs: 1.5 10*3/uL (ref 0.7–4.0)
MCH: 32.4 pg (ref 26.0–34.0)
MCHC: 33.9 g/dL (ref 30.0–36.0)
MCV: 95.3 fL (ref 78.0–100.0)
Monocytes Absolute: 0.3 10*3/uL (ref 0.1–1.0)
Monocytes Relative: 11 % (ref 3–12)
NEUTROS ABS: 1 10*3/uL — AB (ref 1.7–7.7)
Neutrophils Relative %: 35 % — ABNORMAL LOW (ref 43–77)
Platelets: 230 10*3/uL (ref 150–400)
RBC: 5.13 MIL/uL (ref 4.22–5.81)
RDW: 14.1 % (ref 11.5–15.5)
WBC: 3 10*3/uL — ABNORMAL LOW (ref 4.0–10.5)

## 2014-04-23 LAB — COMPREHENSIVE METABOLIC PANEL
ALT: 182 U/L — ABNORMAL HIGH (ref 0–53)
ANION GAP: 19 — AB (ref 5–15)
AST: 298 U/L — ABNORMAL HIGH (ref 0–37)
Albumin: 3.8 g/dL (ref 3.5–5.2)
Alkaline Phosphatase: 82 U/L (ref 39–117)
BUN: 10 mg/dL (ref 6–23)
CHLORIDE: 96 meq/L (ref 96–112)
CO2: 25 mEq/L (ref 19–32)
CREATININE: 0.91 mg/dL (ref 0.50–1.35)
Calcium: 9 mg/dL (ref 8.4–10.5)
GFR calc non Af Amer: 88 mL/min — ABNORMAL LOW (ref >90)
GLUCOSE: 111 mg/dL — AB (ref 70–99)
POTASSIUM: 4.2 meq/L (ref 3.7–5.3)
Sodium: 140 mEq/L (ref 137–147)
TOTAL PROTEIN: 8.5 g/dL — AB (ref 6.0–8.3)
Total Bilirubin: 0.8 mg/dL (ref 0.3–1.2)

## 2014-04-23 LAB — PROTIME-INR
INR: 1.11 (ref 0.00–1.49)
Prothrombin Time: 14.4 seconds (ref 11.6–15.2)

## 2014-04-23 LAB — TROPONIN I: Troponin I: <0.3 ng/mL (ref <0.30)

## 2014-04-23 LAB — ETHANOL: ALCOHOL ETHYL (B): 231 mg/dL — AB (ref 0–11)

## 2014-04-23 MED ORDER — THIAMINE HCL 100 MG/ML IJ SOLN
100.0000 mg | Freq: Every day | INTRAMUSCULAR | Status: DC
  Filled 2014-04-23: qty 1

## 2014-04-23 MED ORDER — ENOXAPARIN SODIUM 40 MG/0.4ML ~~LOC~~ SOLN
40.0000 mg | SUBCUTANEOUS | Status: DC
  Administered 2014-04-23 – 2014-04-25 (×3): 40 mg via SUBCUTANEOUS
  Filled 2014-04-23 (×4): qty 0.4

## 2014-04-23 MED ORDER — SODIUM CHLORIDE 0.9 % IJ SOLN
3.0000 mL | Freq: Two times a day (BID) | INTRAMUSCULAR | Status: DC
  Administered 2014-04-24 – 2014-04-25 (×2): 3 mL via INTRAVENOUS

## 2014-04-23 MED ORDER — LORAZEPAM 1 MG PO TABS
1.0000 mg | ORAL_TABLET | Freq: Four times a day (QID) | ORAL | Status: DC | PRN
  Administered 2014-04-24 (×2): 1 mg via ORAL
  Filled 2014-04-23 (×2): qty 1

## 2014-04-23 MED ORDER — FAMOTIDINE 20 MG PO TABS
20.0000 mg | ORAL_TABLET | Freq: Two times a day (BID) | ORAL | Status: DC
  Administered 2014-04-23 – 2014-04-26 (×7): 20 mg via ORAL
  Filled 2014-04-23 (×7): qty 1

## 2014-04-23 MED ORDER — HYDRALAZINE HCL 20 MG/ML IJ SOLN
10.0000 mg | Freq: Four times a day (QID) | INTRAMUSCULAR | Status: DC | PRN
  Administered 2014-04-23 – 2014-04-24 (×2): 10 mg via INTRAVENOUS
  Filled 2014-04-23 (×2): qty 1

## 2014-04-23 MED ORDER — DIAZEPAM 5 MG PO TABS
5.0000 mg | ORAL_TABLET | Freq: Three times a day (TID) | ORAL | Status: DC
  Administered 2014-04-23 – 2014-04-26 (×9): 5 mg via ORAL
  Filled 2014-04-23 (×9): qty 1

## 2014-04-23 MED ORDER — THIAMINE HCL 100 MG/ML IJ SOLN
100.0000 mg | Freq: Every day | INTRAMUSCULAR | Status: DC
  Filled 2014-04-23 (×3): qty 1

## 2014-04-23 MED ORDER — FOLIC ACID 1 MG PO TABS
1.0000 mg | ORAL_TABLET | Freq: Every day | ORAL | Status: DC
  Administered 2014-04-24 – 2014-04-26 (×3): 1 mg via ORAL
  Filled 2014-04-23 (×3): qty 1

## 2014-04-23 MED ORDER — FOLIC ACID 1 MG PO TABS
1.0000 mg | ORAL_TABLET | Freq: Every day | ORAL | Status: DC
  Filled 2014-04-23: qty 1

## 2014-04-23 MED ORDER — LORAZEPAM 2 MG/ML IJ SOLN: 1.0000 mg | Freq: Four times a day (QID) | INTRAMUSCULAR | Status: DC | PRN

## 2014-04-23 MED ORDER — ADULT MULTIVITAMIN W/MINERALS CH
1.0000 | ORAL_TABLET | Freq: Every day | ORAL | Status: DC
  Filled 2014-04-23: qty 1

## 2014-04-23 MED ORDER — ALUM & MAG HYDROXIDE-SIMETH 200-200-20 MG/5ML PO SUSP: 30.0000 mL | Freq: Four times a day (QID) | ORAL | Status: DC | PRN

## 2014-04-23 MED ORDER — ONDANSETRON HCL 4 MG/2ML IJ SOLN: 4.0000 mg | Freq: Four times a day (QID) | INTRAMUSCULAR | Status: DC | PRN

## 2014-04-23 MED ORDER — ADULT MULTIVITAMIN W/MINERALS CH
1.0000 | ORAL_TABLET | Freq: Every day | ORAL | Status: DC
  Administered 2014-04-24 – 2014-04-26 (×3): 1 via ORAL
  Filled 2014-04-23 (×3): qty 1

## 2014-04-23 MED ORDER — VITAMIN B-1 100 MG PO TABS
100.0000 mg | ORAL_TABLET | Freq: Every day | ORAL | Status: DC
  Administered 2014-04-24 – 2014-04-26 (×3): 100 mg via ORAL
  Filled 2014-04-23 (×3): qty 1

## 2014-04-23 MED ORDER — THIAMINE HCL 100 MG/ML IJ SOLN
Freq: Once | INTRAVENOUS | Status: AC
  Administered 2014-04-23: 15:00:00 via INTRAVENOUS
  Filled 2014-04-23: qty 1000

## 2014-04-23 MED ORDER — VITAMIN B-1 100 MG PO TABS
100.0000 mg | ORAL_TABLET | Freq: Every day | ORAL | Status: DC
Start: 2014-04-23 — End: 2014-04-23
  Filled 2014-04-23: qty 1

## 2014-04-23 MED ORDER — LORAZEPAM 1 MG PO TABS
2.0000 mg | ORAL_TABLET | Freq: Once | ORAL | Status: AC
  Administered 2014-04-23: 2 mg via ORAL
  Filled 2014-04-23: qty 2

## 2014-04-23 MED ORDER — SODIUM CHLORIDE 0.9 % IV SOLN
INTRAVENOUS | Status: DC
  Administered 2014-04-24 – 2014-04-25 (×3): via INTRAVENOUS

## 2014-04-23 MED ORDER — NICOTINE 21 MG/24HR TD PT24
21.0000 mg | MEDICATED_PATCH | Freq: Every day | TRANSDERMAL | Status: DC
  Administered 2014-04-23 – 2014-04-25 (×3): 21 mg via TRANSDERMAL
  Filled 2014-04-23 (×4): qty 1

## 2014-04-23 MED ORDER — LORAZEPAM 1 MG PO TABS
0.0000 mg | ORAL_TABLET | Freq: Four times a day (QID) | ORAL | Status: AC
  Administered 2014-04-23: 2 mg via ORAL
  Administered 2014-04-23: 1 mg via ORAL
  Administered 2014-04-24: 2 mg via ORAL
  Administered 2014-04-24: 3 mg via ORAL
  Administered 2014-04-24: 2 mg via ORAL
  Administered 2014-04-24: 3 mg via ORAL
  Administered 2014-04-25: 1 mg via ORAL
  Filled 2014-04-23 (×2): qty 1
  Filled 2014-04-23 (×2): qty 2
  Filled 2014-04-23: qty 4
  Filled 2014-04-23: qty 2
  Filled 2014-04-23: qty 1
  Filled 2014-04-23: qty 3

## 2014-04-23 MED ORDER — LORAZEPAM 1 MG PO TABS
0.0000 mg | ORAL_TABLET | Freq: Two times a day (BID) | ORAL | Status: DC
  Administered 2014-04-25: 1 mg via ORAL
  Filled 2014-04-23: qty 1

## 2014-04-23 MED ORDER — ACETAMINOPHEN 325 MG PO TABS: 650.0000 mg | ORAL_TABLET | Freq: Four times a day (QID) | ORAL | Status: DC | PRN

## 2014-04-23 MED ORDER — SODIUM CHLORIDE 0.9 % IV SOLN
INTRAVENOUS | Status: DC
  Administered 2014-04-23 – 2014-04-26 (×4): via INTRAVENOUS

## 2014-04-23 NOTE — H&P (Addendum)
[facility] History and Physical  [name] KGM:[id number] DOB: 1951-06-09 DOA: 04/23/2014  Referring physician: Lacretia Leigh, MD PCP: [name], MD   Chief Complaint:   Alcohol intoxication and chest pain  HPI:  63 year old male with history of alcohol abuse who was brought to the hospital by his wife for heavy alcohol use and intoxication and complain of substernal chest pain since this morning. Patient reports drinking at least 2 pints of liquor every day for several years which has been affecting his work . He reports drinking anytime he is out of work and for possible days has been binge drinking, intoxicated and vomiting at home. He reports having alcohol withdrawal symptoms in the form of shaking whenever he does not drink more than 1 day. He reports being hospitalized in [place] about 6 years back for almost 40 days for alcohol withdrawal symptoms. Denies being intubated at that time. Patient reports having substernal chest tightness lasting for a minute on 2 different occasions this morning. He denies any radiation of the pain. He also had nausea with several episodes of vomiting yesterday. Patient reports heartburn as well. Patient denies headache, dizziness, fever, chills, palpitations, SOB, abdominal pain, bowel or urinary symptoms. Denies change in weight or appetite. Wife Denies syncope or loss of consciousness, seizure-like activity at home. In the ED patient was found to have elevated blood pressure and tachycardic. He also had mild tremors as per ED physician. Blood work done showed WBC of 3, normal hemoglobin and platelets, normal chemistry With markedly elevated LFTs. Alcohol level was 231.initial troponin was negative. EKG was unremarkable Patient reported his last drink to be few hours prior to ED visit. [facility] admission requested on telemetry.  Review of Systems:  Constitutional: Denies fever, chills, diaphoresis, appetite change and  fatigue.  HEENT: Denies visual or auditory symptoms, congestion, difficulty swallowing, neck pain,.   Respiratory: Chest tightness, Denies SOB, DOE, cough,  and wheezing.   Cardiovascular: chest pain, denies palpitations and leg swelling.  Gastrointestinal:  nausea, vomiting, denies abdominal pain, diarrhea, constipation, blood in stool and abdominal distention.  Genitourinary: Denies dysuria,hematuria, flank pain and difficulty urinating.  Musculoskeletal: Denies myalgias, back pain,  Skin: Denies pallor, rash and wound.  Neurological: Denies dizziness, seizures, syncope, weakness, light-headedness, numbness and headaches.  Psychiatric/Behavioral: Denies  mood changes, confusion, nervousness, sleep disturbance and agitation   Past Medical History  Diagnosis Date  . ALLERGIC RHINITIS 04/19/2009  . ASTHMA 04/19/2009  . COLONIC POLYPS, HX OF 04/19/2009  . OSTEOARTHRITIS 04/19/2009  . Eczema    Past Surgical History  Procedure Laterality Date  . Joint replacement      left hip  . Prostatectomy      September 14, 2013   Social History:  reports that he quit smoking about 2 years ago. He has never used smokeless tobacco. He reports that he does not drink alcohol or use illicit drugs.  Allergies  Allergen Reactions  . Horse-Derived Products Anaphylaxis  . Meperidine Nausea And Vomiting    "patient states that he felt like he was blacking out and couldn't wake up.    History reviewed. No pertinent family history.  Prior to Admission medications   Medication Sig Start Date End Date Taking? Authorizing Provider  clonazePAM (KLONOPIN) 0.5 MG tablet Take 0.5 mg by mouth 3 (three) times daily as needed for anxiety.   Yes Historical Provider, MD  hydrOXYzine (ATARAX/VISTARIL) 25 MG tablet Take 25 mg by mouth 3 (three) times daily as needed for itching.  Yes Historical Provider, MD  ibuprofen (ADVIL,MOTRIN) 200 MG tablet Take 400 mg by mouth every 6 (six) hours as needed for moderate pain.    Yes Historical Provider, MD  Multiple Vitamin (MULTIVITAMIN WITH MINERALS) TABS tablet Take 1 tablet by mouth daily.   Yes Historical Provider, MD  Omega-3 Fatty Acids (FISH OIL) 1000 MG CAPS Take 2 capsules by mouth daily.   Yes Historical Provider, MD  tadalafil (CIALIS) 5 MG tablet Take 5 mg by mouth. Take 1 tablet 5 days per week 10/24/13  Yes Historical Provider, MD  thiamine (VITAMIN B-1) 100 MG tablet Take 100 mg by mouth daily.   Yes Historical Provider, MD  triamcinolone cream (KENALOG) 0.1 % Apply 1 application topically 2 (two) times daily as needed (rash / irritaion).    Yes Historical Provider, MD  vitamin C (ASCORBIC ACID) 500 MG tablet Take 500 mg by mouth daily.   Yes Historical Provider, MD     Physical Exam:  Filed Vitals:   04/23/14 1030 04/23/14 1139 04/23/14 1141 04/23/14 1204  BP: 153/97 170/93 170/93 179/101  Pulse: 94 106 103   Temp:    98.2 F (36.8 C)  TempSrc:    Oral  Resp: 20  18 16   Height:    5\' 10"  (1.778 m)  Weight:    206.8 kg (455 lb 14.6 oz)  SpO2: 93%  100% 97%    Constitutional: Vital signs reviewed. Elderly male in no acute distress  HEENT: no pallor, no icterus, moist oral mucosa, no cervical lymphadenopathy Cardiovascular: RRR, S1 normal, S2 normal, no MRG Chest: CTAB, no wheezes, rales, or rhonchi Abdominal: Soft. Non-tender, non-distended, bowel sounds are normal,  Ext: warm, no edema Neurological: A&O x3, no tremors  Labs on Admission:  Basic Metabolic Panel:  Recent Labs Lab 04/23/14 0844  NA 140  K 4.2  CL 96  CO2 25  GLUCOSE 111*  BUN 10  CREATININE 0.91  CALCIUM 9.0   Liver Function Tests:  Recent Labs Lab 04/23/14 0844  AST 298*  ALT 182*  ALKPHOS 82  BILITOT 0.8  PROT 8.5*  ALBUMIN 3.8   No results found for this basename: LIPASE, AMYLASE,  in the last 168 hours No results found for this basename: AMMONIA,  in the last 168 hours CBC:  Recent Labs Lab 04/23/14 0844  WBC 3.0*  NEUTROABS 1.0*  HGB 16.6   HCT 48.9  MCV 95.3  PLT 230   Cardiac Enzymes:  Recent Labs Lab 04/23/14 0844  TROPONINI <0.30   BNP: No components found with this basename: POCBNP,  CBG: No results found for this basename: GLUCAP,  in the last 168 hours  Radiological Exams on Admission: Dg Chest 2 View  04/23/2014   CLINICAL DATA:  Mild chest pain, tightness in the morning, smoker  EXAM: CHEST  2 VIEW  COMPARISON:  10/08/2013  FINDINGS: The heart size and mediastinal contours are within normal limits. Both lungs are clear. The visualized skeletal structures are unremarkable.  IMPRESSION: No active cardiopulmonary disease.   Electronically Signed   By: Kathreen Devoid   On: 04/23/2014 09:04    EKG: Normal sinus rhythm, no ST-T changes  Assessment/Plan   Principle problem Alcohol intoxication with early withdrawal Admit to telemetry. Will place on banana bag followed by IV normal saline at 100 mL per hour. Monitor on CIWA. High risk for active withdrawal and DTs. I will place him on scheduled Valium 5 mg 3 times a day. -Supportive care with vitamin,  folate and multivitamin. Order Pepcid and Maalox for GI symptoms. -Patient and wife highly interested in [place] alcohol and participating in rehabilitation. Will place social work consult.   Active Problems: Chest pain Appears atypical and likely associated with alcoholic gastritis. Patient also has been vomiting several times at home. Monitor on telemetry. Cycle serial enzymes.   Elevated blood pressure Likely secondary to early alcohol withdrawal. Place on when necessary hydralazine  Transaminitis Likely secondary to alcoholic hepatitis. Check INR and liver ultrasound  neutropenia  mild possibly from etoh use . monitor   Tobacco abuse Counseled on cessation. Ordered nicotine patch  Diet: cardiac  DVT prophylaxis: sq lovenox   Code Status: full code Family Communication: discussed with wife and his wife at bedside Disposition Plan: home Once  improved  Louellen Molder [facility] Pager [phone]  Total time spent on admission :60 minutes  If 7PM-7AM, please contact night-coverage [email or web address] Password [facility] 04/23/2014, 12:43 PM

## 2014-04-23 NOTE — Progress Notes (Signed)
MD notified of patients increased blood pressure. Blood pressure medication given earlier. No new orders received at this time. Will continue to monitor patient.Setzer, Marchelle Folks

## 2014-04-23 NOTE — ED Notes (Signed)
Pt states he's here for detox, had detox in [place] about 6 years ago, has seen his doctor and placed on medication to help w/ withdrawal but has been unable to quit by himself, states he wants detox before it kills him, pt states has been binge drinking this past week, states last had something to drink around 5-6 am this morning, states finished a bottle of wine, pt and girlfriend state pt does not sleep, states will take a nap then wake up and drink more, drinks on average 2 1/5's of liquor/wine a day.  Pt states yesterday had centralized chest tightness for 10 minutes, pain did not radiate anywhere and had no other symptoms, denies pain at this time, pt states when he does not drink at times he will get the shakes.

## 2014-04-23 NOTE — ED Notes (Signed)
Patient transported to X-ray 

## 2014-04-23 NOTE — ED Provider Notes (Signed)
CSN: [id number]     Arrival date & time 04/23/14  0750 History   First MD Initiated Contact with Patient 04/23/14 [phone]     Chief Complaint  Patient presents with  . etoh withdrawal   . Chest Pain     (Consider location/radiation/quality/duration/timing/severity/associated sxs/prior Treatment) HPI Comments: Patient here complaining of chest pain that began yesterday while at rest. Pain lasted for approximately 10 minutes and was located substernal without radiation. No associated dyspnea or diaphoresis. Symptoms resolved spontaneously. Patient has recently attempted to decrease his alcohol intake. He normally drinks 2 bottles of wine a day and his last treatment was this morning. Denies any suicidal or homicidal ideations. No abdominal pain or vomiting. No black or bloody stools. Patient has had no recurrence of his chest pain. Is requesting help for alcohol detox as well. No recent cough or congestion. No treatment use prior to arrival.  Patient is a 63 y.o. male presenting with chest pain. The history is provided by the patient and a significant other.  Chest Pain   Past Medical History  Diagnosis Date  . ALLERGIC RHINITIS 04/19/2009  . ASTHMA 04/19/2009  . COLONIC POLYPS, HX OF 04/19/2009  . OSTEOARTHRITIS 04/19/2009  . Eczema    Past Surgical History  Procedure Laterality Date  . Joint replacement      left hip  . Prostatectomy      September 14, 2013   No family history on file. History  Substance Use Topics  . Smoking status: Former Smoker -- 1.00 packs/day    Quit date: 07/03/2011  . Smokeless tobacco: Never Used  . Alcohol Use: No    Review of Systems  Cardiovascular: Positive for chest pain.  All other systems reviewed and are negative.     Allergies  Horse-derived products and Meperidine  Home Medications   Prior to Admission medications   Medication Sig Start Date End Date Taking? Authorizing Provider  clonazePAM (KLONOPIN) 0.5 MG tablet Take 0.5 mg by  mouth 3 (three) times daily as needed for anxiety.   Yes Historical Provider, MD  hydrOXYzine (ATARAX/VISTARIL) 25 MG tablet Take 25 mg by mouth 3 (three) times daily as needed for itching.   Yes Historical Provider, MD  ibuprofen (ADVIL,MOTRIN) 200 MG tablet Take 400 mg by mouth every 6 (six) hours as needed for moderate pain.   Yes Historical Provider, MD  Multiple Vitamin (MULTIVITAMIN WITH MINERALS) TABS tablet Take 1 tablet by mouth daily.   Yes Historical Provider, MD  Omega-3 Fatty Acids (FISH OIL) 1000 MG CAPS Take 2 capsules by mouth daily.   Yes Historical Provider, MD  tadalafil (CIALIS) 5 MG tablet Take 5 mg by mouth. Take 1 tablet 5 days per week 10/24/13  Yes Historical Provider, MD  thiamine (VITAMIN B-1) 100 MG tablet Take 100 mg by mouth daily.   Yes Historical Provider, MD  triamcinolone cream (KENALOG) 0.1 % Apply 1 application topically 2 (two) times daily as needed (rash / irritaion).    Yes Historical Provider, MD  vitamin C (ASCORBIC ACID) 500 MG tablet Take 500 mg by mouth daily.   Yes Historical Provider, MD   BP 174/101  Pulse 107  Temp(Src) 98.4 F (36.9 C) (Oral)  Resp 16  SpO2 91% Physical Exam  Nursing note and vitals reviewed. Constitutional: He is oriented to person, place, and time. He appears well-developed and well-nourished.  Non-toxic appearance. No distress.  HENT:  Head: Normocephalic and atraumatic.  Eyes: Conjunctivae, EOM and lids are normal. Pupils  are equal, round, and reactive to light.  Neck: Normal range of motion. Neck supple. No tracheal deviation present. No mass present.  Cardiovascular: Regular rhythm and normal heart sounds.  Tachycardia present.  Exam reveals no gallop.   No murmur heard. Pulmonary/Chest: Effort normal and breath sounds normal. No stridor. No respiratory distress. He has no decreased breath sounds. He has no wheezes. He has no rhonchi. He has no rales.  Abdominal: Soft. Normal appearance and bowel sounds are normal. He  exhibits no distension. There is no tenderness. There is no rebound and no CVA tenderness.  Musculoskeletal: Normal range of motion. He exhibits no edema and no tenderness.  Neurological: He is alert and oriented to person, place, and time. He has normal strength. No cranial nerve deficit or sensory deficit. GCS eye subscore is 4. GCS verbal subscore is 5. GCS motor subscore is 6.  Skin: Skin is warm and dry. No abrasion and no rash noted.  Psychiatric: His speech is normal and behavior is normal. His mood appears anxious. He expresses no suicidal plans and no homicidal plans.    ED Course  Procedures (including critical care time) Labs Review Labs Reviewed  ETHANOL  URINE RAPID DRUG SCREEN (HOSP PERFORMED)  TROPONIN I  CBC WITH DIFFERENTIAL  COMPREHENSIVE METABOLIC PANEL    Imaging Review No results found.   EKG Interpretation   Date/Time:  Monday April 23 2014 08:07:35 EDT Ventricular Rate:  91 PR Interval:  185 QRS Duration: 103 QT Interval:  373 QTC Calculation: 459 R Axis:   -59 Text Interpretation:  Sinus rhythm Incomplete RBBB and LAFB ST elevation  suggests acute pericarditis Baseline wander in lead(s) V5 No significant  change since last tracing Confirmed by Amiera Herzberg  MD, Cambrey Lupi ([id number]) on  04/23/2014 8:27:42 AM      MDM   Final diagnoses:  Chest pain    She given IV fluids and Ativan here for tremors. He had a second episode of substernal chest pressure which lasted for about 20 minutes. This is in addition to his initial episode of chest pressure yesterday was lasted for approximately 10 minutes. We'll consult [facility] for patient to be admitted for ACS rule out.    Leota Jacobsen, MD 04/23/14 1028

## 2014-04-23 NOTE — ED Notes (Signed)
Patient is aware we need a urine specimen. 

## 2014-04-23 NOTE — ED Notes (Addendum)
Pt presents to ER for etoh detox.  Pt states that he drinks 2 fifths per day.  Last drink this morning at 5 am.  States that he has been vomiting for a week.  Also c/o "chest tightness" x 2 days, denies SOB.  Pt states he hears ringing in his ears.  Denies SI/HI.

## 2014-04-23 NOTE — ED Notes (Signed)
Patient is attempting to give urine specimen.

## 2014-04-24 ENCOUNTER — Inpatient Hospital Stay (HOSPITAL_COMMUNITY): Payer: 59

## 2014-04-24 DIAGNOSIS — R1011 Right upper quadrant pain: Secondary | ICD-10-CM

## 2014-04-24 DIAGNOSIS — Z720 Tobacco use: Secondary | ICD-10-CM

## 2014-04-24 DIAGNOSIS — F10239 Alcohol dependence with withdrawal, unspecified: Principal | ICD-10-CM

## 2014-04-24 LAB — TROPONIN I: Troponin I: <0.3 ng/mL (ref <0.30)

## 2014-04-24 LAB — VITAMIN B12: Vitamin B-12: 417 pg/mL (ref 211–911)

## 2014-04-24 MED ORDER — VITAMIN C 500 MG PO TABS
500.0000 mg | ORAL_TABLET | Freq: Every day | ORAL | Status: DC
Start: 2014-04-24 — End: 2014-04-26
  Administered 2014-04-24 – 2014-04-26 (×3): 500 mg via ORAL
  Filled 2014-04-24 (×3): qty 1

## 2014-04-24 MED ORDER — OMEGA-3-ACID ETHYL ESTERS 1 G PO CAPS
1.0000 g | ORAL_CAPSULE | Freq: Every day | ORAL | Status: DC
Start: 2014-04-24 — End: 2014-04-26
  Administered 2014-04-24 – 2014-04-26 (×3): 1 g via ORAL
  Filled 2014-04-24 (×3): qty 1

## 2014-04-24 MED ORDER — SUCRALFATE 1 GM/10ML PO SUSP
1.0000 g | Freq: Three times a day (TID) | ORAL | Status: DC
  Administered 2014-04-24 – 2014-04-26 (×6): 1 g via ORAL
  Filled 2014-04-24 (×8): qty 10

## 2014-04-24 MED ORDER — HYDROXYZINE HCL 25 MG PO TABS
25.0000 mg | ORAL_TABLET | Freq: Three times a day (TID) | ORAL | Status: DC | PRN
  Administered 2014-04-26: 25 mg via ORAL
  Filled 2014-04-24 (×2): qty 1

## 2014-04-24 MED ORDER — METOPROLOL SUCCINATE ER 25 MG PO TB24
50.0000 mg | ORAL_TABLET | Freq: Every day | ORAL | Status: DC
  Administered 2014-04-24 – 2014-04-26 (×3): 50 mg via ORAL
  Filled 2014-04-24 (×3): qty 2

## 2014-04-24 NOTE — Progress Notes (Signed)
[facility] PROGRESS NOTE  Steffen Hase FGH:[id number] DOB: Mar 07, 1951 DOA: 04/23/2014 PCP: [name], MD  Assessment/Plan: 1-Chest pain: non cardiac -secondary to alcoholic gastritis and GERD -cardiac enzymes neg X3 -and no EKG abnormalities -symptoms improved with PPI -will add carafate  Elevated blood pressure: no prior hx of HTN  -Likely secondary to early alcohol withdrawal.  -but potentially also undiagnosed HTN -will start toprol -continue PRN hydralazine   Transaminitis  -Likely secondary to alcoholic hepatitis.  -abd US demonstrated liver steatosis   neutropenia  -mild possibly from etoh use .  -CBC in am  Tobacco abuse  Counseled on cessation. Continue nicotine patch  Off balance -due to alcohol most likely -will ask PT evaluation -check B12   Code Status: Full Family Communication: no family at bedside Disposition Plan: to be determine   Consultants:  None   Procedures:  Abd [place]: IMPRESSION:  1. No acute findings.  2. Hepatic steatosis. No liver mass.  3. No other abnormalities.    Antibiotics:  None   HPI/Subjective: Denies CP, no SOB. Patient is afebrile. Feeling off balance and with ongoing hiccups and reflux symptoms  Objective: Filed Vitals:   04/24/14 2110  BP: 170/105  Pulse: 105  Temp: 98.5 F (36.9 C)  Resp: 18    Intake/Output Summary (Last 24 hours) at 04/24/14 2136 Last data filed at 04/24/14 1825  Gross per 24 hour  Intake 2879.58 ml  Output   1300 ml  Net 1579.58 ml   Filed Weights   04/23/14 1204 04/23/14 1207  Weight: 93.804 kg (206 lb 12.8 oz) 93.804 kg (206 lb 12.8 oz)    Exam:   General:  AAOX3, feeling off balance, no further CP; positive hiccups. Afebrile   Cardiovascular: tachycardic, S1 and S2, no rubs or gallops  Respiratory: CTA bilaterally  Abdomen: slight distension, positive BS; epigastric discomfort with palption  Musculoskeletal: no cyanosis or clubbing  Data  Reviewed: Basic Metabolic Panel:  Recent Labs Lab 04/23/14 0844  NA 140  K 4.2  CL 96  CO2 25  GLUCOSE 111*  BUN 10  CREATININE 0.91  CALCIUM 9.0   Liver Function Tests:  Recent Labs Lab 04/23/14 0844  AST 298*  ALT 182*  ALKPHOS 82  BILITOT 0.8  PROT 8.5*  ALBUMIN 3.8   CBC:  Recent Labs Lab 04/23/14 0844  WBC 3.0*  NEUTROABS 1.0*  HGB 16.6  HCT 48.9  MCV 95.3  PLT 230   Cardiac Enzymes:  Recent Labs Lab 04/23/14 0844 04/23/14 1825 04/23/14 2306 04/24/14 0515  TROPONINI <0.30 <0.30 <0.30 <0.30     Studies: Dg Chest 2 View  04/23/2014   CLINICAL DATA:  Mild chest pain, tightness in the morning, smoker  EXAM: CHEST  2 VIEW  COMPARISON:  10/08/2013  FINDINGS: The heart size and mediastinal contours are within normal limits. Both lungs are clear. The visualized skeletal structures are unremarkable.  IMPRESSION: No active cardiopulmonary disease.   Electronically Signed   By: Kathreen Devoid   On: 04/23/2014 09:04   US Abdomen Complete  04/24/2014   CLINICAL DATA:  Patient admitted to the hospital yesterday for alcohol intoxication and chest pain. Substernal chest pain began yesterday morning. Patient vomiting at home. Patient has elevated liver function tests.  EXAM: ULTRASOUND ABDOMEN COMPLETE  COMPARISON:  CT, 10/08/2013.  FINDINGS: Gallbladder: No gallstones or wall thickening visualized. No sonographic Murphy sign noted.  Common bile duct: Diameter: 5.7 mm  Liver: Diffusely increased parenchymal echogenicity with decreased through transmission  of the sound beam. Liver appears borderline enlarged. No liver mass or focal lesion. Hepatopetal flow was documented in the portal vein.  IVC: No abnormality visualized.  Pancreas: Visualized portion unremarkable.  Spleen: Size and appearance within normal limits.  Right Kidney: Length: 10.9 cm. Echogenicity within normal limits. No mass or hydronephrosis visualized.  Left Kidney: Length: 9.9 cm. Echogenicity within  normal limits. No mass or hydronephrosis visualized.  Abdominal aorta: No aneurysm visualized.  Other findings: None.  IMPRESSION: 1. No acute findings. 2. Hepatic steatosis.  No liver mass. 3. No other abnormalities.   Electronically Signed   By: Lajean Manes M.D.   On: 04/24/2014 09:39    Scheduled Meds: . diazepam  5 mg Oral 3 times per day  . enoxaparin (LOVENOX) injection  40 mg Subcutaneous Q24H  . famotidine  20 mg Oral BID  . folic acid  1 mg Oral Daily  . LORazepam  0-4 mg Oral 4 times per day   Followed by  . [START ON 04/25/2014] LORazepam  0-4 mg Oral Q12H  . metoprolol succinate  50 mg Oral Daily  . multivitamin with minerals  1 tablet Oral Daily  . nicotine  21 mg Transdermal Daily  . omega-3 acid ethyl esters  1 g Oral Daily  . sodium chloride  3 mL Intravenous Q12H  . thiamine  100 mg Oral Daily   Or  . thiamine  100 mg Intravenous Daily  . vitamin C  500 mg Oral Daily   Continuous Infusions: . sodium chloride 125 mL/hr at 04/24/14 0953  . sodium chloride 100 mL/hr at 04/24/14 9038    Active Problems:   Alcohol withdrawal   Chest pain   Alcohol abuse with intoxication   Elevated blood pressure   Tobacco use disorder   Transaminitis    Time spent: 30 minutes    Barton Dubois  [facility] Pager [phone]. If 7PM-7AM, please contact night-coverage at [email or web address], password [facility] 04/24/2014, 9:36 PM  LOS: 1 day

## 2014-04-24 NOTE — Progress Notes (Signed)
Nutrition Brief Note  Patient identified on the Malnutrition Screening Tool (MST) Report  Wt Readings from Last 15 Encounters:  04/23/14 206 lb 12.8 oz (93.804 kg)  12/04/13 205 lb (92.987 kg)  11/06/13 205 lb (92.987 kg)  10/09/13 221 lb 9 oz (100.5 kg)  04/13/13 206 lb (93.441 kg)  10/13/12 204 lb (92.534 kg)  04/04/12 213 lb (96.616 kg)  12/15/11 211 lb (95.709 kg)  10/12/11 212 lb (96.163 kg)  08/21/10 211 lb (95.709 kg)  06/16/10 226 lb (102.513 kg)  02/20/10 219 lb (99.338 kg)  09/05/09 219 lb (99.338 kg)  06/03/09 215 lb (97.523 kg)  04/19/09 212 lb (96.163 kg)    Body mass index is 29.67 kg/(m^2). Patient meets criteria for Overweight based on current BMI.   Current diet order is Regular, patient is consuming approximately 50-75% of meals at this time. Labs and medications reviewed.   Pt denied changes in appetite or weight pta. Diet recall indicates pt consuming two meals daily, and has been trying to increase intake of fruits and vegetables by preparing smoothies. Pt reported an unintentional increase in weight as he has stopped going to the gym as frequently.   May benefit from multivitamin d/t hx of ETOH abuse. No nutrition interventions warranted at this time. If nutrition issues arise, please consult RD.   Atlee Abide MS RD LDN Clinical Dietitian [phone]

## 2014-04-24 NOTE — Care Management Note (Signed)
    Page 1 of 1   04/24/2014     3:07:27 PM CARE MANAGEMENT NOTE 04/24/2014  Patient:  [name], [name]   Account Number:  [id number]  Date Initiated:  04/24/2014  Documentation initiated by:  Dessa Phi  Subjective/Objective Assessment:   63 Y/O M ADMITTED W/ETOH INTOXICATION.MH:WKGS.     Action/Plan:   FROM HOME.HAS INSURANCE,PCP,PHARMACY.   Anticipated DC Date:  04/26/2014   Anticipated DC Plan:  [place]  CM consult      Choice offered to / List presented to:             Status of service:  In process, will continue to follow Medicare Important Message given?   (If response is "NO", the following Medicare IM given date fields will be blank) Date Medicare IM given:   Medicare IM given by:   Date Additional Medicare IM given:   Additional Medicare IM given by:    Discharge Disposition:    Per UR Regulation:  Reviewed for med. necessity/level of care/duration of stay  If discussed at [place] of Stay Meetings, dates discussed:    Comments:  04/24/14 Khiree Bukhari RN,BSN NCM [place] W/ADMIT-PATIENT [facility].PT CONS-AWAIT RECOMMENDATIONS.

## 2014-04-25 DIAGNOSIS — R0789 Other chest pain: Secondary | ICD-10-CM

## 2014-04-25 LAB — BASIC METABOLIC PANEL
ANION GAP: 15 (ref 5–15)
BUN: 11 mg/dL (ref 6–23)
CALCIUM: 9 mg/dL (ref 8.4–10.5)
CO2: 23 mEq/L (ref 19–32)
Chloride: 102 mEq/L (ref 96–112)
Creatinine, Ser: 0.87 mg/dL (ref 0.50–1.35)
GFR, EST NON AFRICAN AMERICAN: 89 mL/min — AB (ref >90)
Glucose, Bld: 108 mg/dL — ABNORMAL HIGH (ref 70–99)
POTASSIUM: 3.8 meq/L (ref 3.7–5.3)
Sodium: 140 mEq/L (ref 137–147)

## 2014-04-25 LAB — CBC
HCT: 45.5 % (ref 39.0–52.0)
HEMOGLOBIN: 15.3 g/dL (ref 13.0–17.0)
MCH: 31.4 pg (ref 26.0–34.0)
MCHC: 33.6 g/dL (ref 30.0–36.0)
MCV: 93.4 fL (ref 78.0–100.0)
Platelets: 209 10*3/uL (ref 150–400)
RBC: 4.87 MIL/uL (ref 4.22–5.81)
RDW: 13.7 % (ref 11.5–15.5)
WBC: 3.5 10*3/uL — ABNORMAL LOW (ref 4.0–10.5)

## 2014-04-25 MED ORDER — LORAZEPAM 1 MG PO TABS
1.0000 mg | ORAL_TABLET | Freq: Four times a day (QID) | ORAL | Status: DC | PRN
  Administered 2014-04-25: 1 mg via ORAL
  Filled 2014-04-25: qty 1

## 2014-04-25 MED ORDER — THIAMINE HCL 100 MG/ML IJ SOLN
100.0000 mg | Freq: Every day | INTRAMUSCULAR | Status: DC
  Filled 2014-04-25: qty 1

## 2014-04-25 MED ORDER — HYDRALAZINE HCL 10 MG PO TABS
10.0000 mg | ORAL_TABLET | Freq: Three times a day (TID) | ORAL | Status: DC
  Administered 2014-04-25 – 2014-04-26 (×3): 10 mg via ORAL
  Filled 2014-04-25 (×4): qty 1

## 2014-04-25 MED ORDER — VITAMIN B-1 100 MG PO TABS
100.0000 mg | ORAL_TABLET | Freq: Every day | ORAL | Status: DC
  Filled 2014-04-25: qty 1

## 2014-04-25 MED ORDER — LORAZEPAM 2 MG/ML IJ SOLN: 1.0000 mg | Freq: Four times a day (QID) | INTRAMUSCULAR | Status: DC | PRN

## 2014-04-25 MED ORDER — FOLIC ACID 1 MG PO TABS
1.0000 mg | ORAL_TABLET | Freq: Every day | ORAL | Status: DC
  Filled 2014-04-25: qty 1

## 2014-04-25 MED ORDER — ADULT MULTIVITAMIN W/MINERALS CH
1.0000 | ORAL_TABLET | Freq: Every day | ORAL | Status: DC
  Filled 2014-04-25: qty 1

## 2014-04-25 NOTE — Evaluation (Signed)
Physical Therapy Evaluation Patient Details Name: [name] MRN: [id number] DOB: 05-Nov-1950 Today's Date: 04/25/2014   History of Present Illness  63 yo male admitted with ETOH withdrawal, neutropenia, chest pain, HTN. Hx of ETOH abuse  Clinical Impression  On eval, pt required Min guard assist for mobility-able to ambulate ~320 feet using IV pole vs no UE support. Tolerated distance/activity well. Unsteady. Recommend daily mobility/ambulation with nursing supervision in addition to PT visits.     Follow Up Recommendations Home health PT;No PT follow up (depending on progress)    Equipment Recommendations  None recommended by PT    Recommendations for Other Services       Precautions / Restrictions Precautions Precautions: Fall Restrictions Weight Bearing Restrictions: No      Mobility  Bed Mobility                  Transfers Overall transfer level: Needs assistance   Transfers: Sit to/from Stand Sit to Stand: Min guard            Ambulation/Gait Ambulation/Gait assistance: Min guard Ambulation Distance (Feet): 320 Feet Assistive device: None (IV pole)       General Gait Details: Initially used IV pole for support however pt was able to progress to no UE support. close guard for safety. Unsteady at times but no LOB  Financial trader Rankin (Stroke Patients Only)       Balance                                             Pertinent Vitals/Pain Pain Assessment: No/denies pain    Home Living Family/patient expects to be discharged to:: Private residence Living Arrangements: Alone   Type of Home: Apartment Home Access: Stairs to enter Entrance Stairs-Rails: Psychiatric nurse of Steps: 1 flight Home Layout: One level Home Equipment: None      Prior Function Level of Independence: Independent               Hand Dominance        Extremity/Trunk  Assessment   Upper Extremity Assessment: Overall WFL for tasks assessed           Lower Extremity Assessment: Generalized weakness      Cervical / Trunk Assessment: Normal  Communication      Cognition Arousal/Alertness: Awake/alert Behavior During Therapy: Flat affect Overall Cognitive Status: Within Functional Limits for tasks assessed                      General Comments      Exercises        Assessment/Plan    PT Assessment Patient needs continued PT services  PT Diagnosis Difficulty walking   PT Problem List Decreased activity tolerance;Decreased balance;Decreased mobility;Decreased safety awareness  PT Treatment Interventions DME instruction;Gait training;Functional mobility training;Therapeutic activities;Therapeutic exercise;Patient/family education;Balance training   PT Goals (Current goals can be found in the Care Plan section) Acute Rehab PT Goals Patient Stated Goal: none stated PT Goal Formulation: With patient Time For Goal Achievement: 05/09/14 Potential to Achieve Goals: Good    Frequency Min 3X/week   Barriers to discharge        Co-evaluation               End of Session  Equipment Utilized During Treatment: Gait belt Activity Tolerance: Patient tolerated treatment well Patient left: in bed;with call bell/phone within reach (sitting at EOB with bed alarm set-NT aware)           Time: 7915-0413 PT Time Calculation (min): 13 min   Charges:   PT Evaluation $Initial PT Evaluation Tier I: 1 Procedure PT Treatments $Gait Training: 8-22 mins   PT G Codes:          Weston Anna, MPT Pager: [phone]

## 2014-04-25 NOTE — Progress Notes (Signed)
Clinical Social Work Department BRIEF PSYCHOSOCIAL ASSESSMENT 04/25/2014  Patient:  [name], [name]     Account Number:  [id number]     Admit date:  04/23/2014  Clinical Social Worker:  Earlie Server  Date/Time:  04/25/2014 01:15 PM  Referred by:  Physician  Date Referred:  04/25/2014 Referred for  Substance Abuse   Other Referral:   Interview type:  Patient Other interview type:    PSYCHOSOCIAL DATA Living Status:  ALONE Admitted from facility:   Level of care:   Primary support name:  Candace Primary support relationship to patient:  CHILD, ADULT Degree of support available:   Adequate    CURRENT CONCERNS Current Concerns  Substance Abuse   Other Concerns:    SOCIAL WORK ASSESSMENT / PLAN CSW received referral in order to complete psychosocial assessment. CSW reviewed chart and met with patient at bedside. CSW introduced myself and explained role.    Patient reports he was born in [place] but moved to [place] about 28 years ago. Patient has a dtr that lives in [place], a dtr in [place], and a son that passed away in 08/25/22. Patient reports he is not currently in a relationship but does have supportive friends.    Patient reports he came to the hospital for detox. Patient reports his consumption varies but he often binge drinks. Patient states he has been drinking his entire life but knows that he needs to stay sober. Patient went for residential treatment in [place] about 8 years ago and stayed sober for 6 years. Patient reports "I met the wrong person" and started drinking again about 2 years ago.    Patient is able to identify several triggers to alcohol use. Patient reports a history of anxiety, a recent diagnosis of prostate cancer, and that his son died due to an overdose. Patient reports he is familiar with SA treatment but does not feel he needs residential treatment at this time. Patient has completed intensive outpatient programs (IOP) and AA meetings in the past and currently has a  sponsor. Patient reports he does not feel he needs CSW assistance at this time and plans to return home and remain sober.    CSW agreeable to continue to follow to provide support and assist with resources if patient desires.   Assessment/plan status:  Psychosocial Support/Ongoing Assessment of Needs Other assessment/ plan:   SBIRT   Information/referral to community resources:   SA treatment options    PATIENT'S/FAMILY'S RESPONSE TO PLAN OF CARE: Patient alert and oriented. Patient engaged during assessment but has a flat affect. Patient reports that he wants to stay sober because he was a happier person when he was not drinking alcohol. Patient aware of resources and reports he has participated in several programs such as [employer] and [employer]. Patient thanked CSW for visit but reports he does not have any needs from CSW at this time and is aware of all of his options for treatment.       Sindy Messing, LCSW (Coverage for [employer])

## 2014-04-25 NOTE — Progress Notes (Signed)
Patient ID: [name], male   DOB: [birth year], 63 y.o.   MRN: [id number] [facility] PROGRESS NOTE  Ron Beske JEH:[id number] DOB: 1950-09-15 DOA: 04/23/2014 PCP: [name], MD  Brief narrative: 63 year old male with history of alcohol abuse was brought to the hospital by his wife for heavy alcohol use and intoxication and complain of substernal chest pain since the am of admission, associated with shakes, tremors, non bloody vomiting, poor oral intake.Pt reports being hospitalized in [place] about 6 years back for almost 40 days for alcohol withdrawal symptoms. Denies being intubated at that time.   Assessment and Plan:    Assessment/Plan:  Chest pain - secondary to alcoholic gastritis and GERD  - cardiac enzymes neg X3  - no EKG abnormalities  - symptoms improved with PPI and carafate  Elevated blood pressure: no prior hx of HTN  - Likely secondary to early alcohol withdrawal and undiagnosed HTN  - continue toprol and hydralazine  Alcohol abuse - counseled on cessation - keep on CIWA protocol  Transaminitis  - Likely secondary to alcoholic hepatitis.  - abd US demonstrated liver steatosis  - repeat CMET in AM Neutropenia  - secondary to alcohol induced bone marrow damage  - repeat CBC in AM Tobacco abuse  - Counseled on cessation. Continue nicotine patch  Off balance  - due to alcohol most likely  - PT evaluation requested  DVT prophylaxis  Lovenox SQ while pt is in hospital  Code Status: Full Family Communication: Pt at bedside Disposition Plan: Home when medically stable  IV Access:   Peripheral IV Procedures and diagnostic studies:   US Abdomen Complete 04/24/2014  No acute findings. Hepatic steatosis.  No liver mass. No other abnormalities.    Medical Consultants:   None  Other Consultants:   Physical therapy  Anti-Infectives:   None   Faye Ramsay, MD  [facility] Pager [phone]  If 7PM-7AM, please contact  night-coverage [email or web address] Password [facility] 04/25/2014, 12:57 PM   LOS: 2 days   HPI/Subjective: No events overnight.   Objective: Filed Vitals:   04/24/14 2110 04/24/14 2219 04/24/14 2319 04/25/14 0526  BP: 170/105 157/116 164/106 155/106  Pulse: 105 100 101 94  Temp: 98.5 F (36.9 C)   98.6 F (37 C)  TempSrc: Oral   Oral  Resp: 18   20  Height:      Weight:      SpO2: 98% 100%  96%    Intake/Output Summary (Last 24 hours) at 04/25/14 1257 Last data filed at 04/25/14 0277  Gross per 24 hour  Intake 2199.99 ml  Output   2625 ml  Net -425.01 ml    Exam:   General:  Pt is alert, follows commands appropriately, not in acute distress  Cardiovascular: Regular rate and rhythm, S1/S2, no murmurs, no rubs, no gallops  Respiratory: Clear to auscultation bilaterally, no wheezing, no crackles, no rhonchi  Abdomen: Soft, non tender, non distended, bowel sounds present, no guarding  Extremities: No edema, pulses DP and PT palpable bilaterally  Neuro: Grossly nonfocal  Data Reviewed: Basic Metabolic Panel:  Recent Labs Lab 04/23/14 0844 04/25/14 0330  NA 140 140  K 4.2 3.8  CL 96 102  CO2 25 23  GLUCOSE 111* 108*  BUN 10 11  CREATININE 0.91 0.87  CALCIUM 9.0 9.0   Liver Function Tests:  Recent Labs Lab 04/23/14 0844  AST 298*  ALT 182*  ALKPHOS 82  BILITOT 0.8  PROT 8.5*  ALBUMIN 3.8   CBC:  Recent Labs Lab 04/23/14 0844 04/25/14 0330  WBC 3.0* 3.5*  NEUTROABS 1.0*  --   HGB 16.6 15.3  HCT 48.9 45.5  MCV 95.3 93.4  PLT 230 209   Cardiac Enzymes:  Recent Labs Lab 04/23/14 0844 04/23/14 1825 04/23/14 2306 04/24/14 0515  TROPONINI <0.30 <0.30 <0.30 <0.30     Scheduled Meds: . diazepam  5 mg Oral 3 times per day  . enoxaparin (LOVENOX) injection  40 mg Subcutaneous Q24H  . famotidine  20 mg Oral BID  . folic acid  1 mg Oral Daily  . LORazepam  0-4 mg Oral Q12H  . metoprolol succinate  50 mg Oral Daily  . multivitamin with  minerals  1 tablet Oral Daily  . nicotine  21 mg Transdermal Daily  . omega-3 acid ethyl esters  1 g Oral Daily  . sodium chloride  3 mL Intravenous Q12H  . sucralfate  1 g Oral TID WC & HS  . thiamine  100 mg Oral Daily   Or  . thiamine  100 mg Intravenous Daily  . vitamin C  500 mg Oral Daily   Continuous Infusions: . sodium chloride 125 mL/hr at 04/24/14 0953  . sodium chloride 100 mL/hr at 04/25/14 0537

## 2014-04-26 DIAGNOSIS — F10230 Alcohol dependence with withdrawal, uncomplicated: Secondary | ICD-10-CM

## 2014-04-26 LAB — COMPREHENSIVE METABOLIC PANEL
ALBUMIN: 3 g/dL — AB (ref 3.5–5.2)
ALK PHOS: 82 U/L (ref 39–117)
ALT: 246 U/L — AB (ref 0–53)
AST: 342 U/L — AB (ref 0–37)
Anion gap: 12 (ref 5–15)
BUN: 14 mg/dL (ref 6–23)
CALCIUM: 8.8 mg/dL (ref 8.4–10.5)
CO2: 22 mEq/L (ref 19–32)
Chloride: 105 mEq/L (ref 96–112)
Creatinine, Ser: 0.84 mg/dL (ref 0.50–1.35)
GFR calc Af Amer: >90 mL/min (ref >90)
GFR calc non Af Amer: >90 mL/min (ref >90)
Glucose, Bld: 100 mg/dL — ABNORMAL HIGH (ref 70–99)
POTASSIUM: 3.4 meq/L — AB (ref 3.7–5.3)
SODIUM: 139 meq/L (ref 137–147)
TOTAL PROTEIN: 7.1 g/dL (ref 6.0–8.3)
Total Bilirubin: 0.5 mg/dL (ref 0.3–1.2)

## 2014-04-26 LAB — CBC
HEMATOCRIT: 43.8 % (ref 39.0–52.0)
Hemoglobin: 14.6 g/dL (ref 13.0–17.0)
MCH: 31.8 pg (ref 26.0–34.0)
MCHC: 33.3 g/dL (ref 30.0–36.0)
MCV: 95.4 fL (ref 78.0–100.0)
Platelets: 198 10*3/uL (ref 150–400)
RBC: 4.59 MIL/uL (ref 4.22–5.81)
RDW: 13.9 % (ref 11.5–15.5)
WBC: 4.9 10*3/uL (ref 4.0–10.5)

## 2014-04-26 MED ORDER — FAMOTIDINE 20 MG PO TABS
20.0000 mg | ORAL_TABLET | Freq: Two times a day (BID) | ORAL | Status: AC
Start: 2014-04-26 — End: ?

## 2014-04-26 MED ORDER — TADALAFIL 5 MG PO TABS: 5.0000 mg | ORAL_TABLET | Freq: Every day | ORAL | Status: DC | PRN

## 2014-04-26 MED ORDER — HYDRALAZINE HCL 10 MG PO TABS: 10.0000 mg | ORAL_TABLET | Freq: Three times a day (TID) | ORAL | Status: DC

## 2014-04-26 MED ORDER — CLONAZEPAM 0.5 MG PO TABS: 0.5000 mg | ORAL_TABLET | Freq: Three times a day (TID) | ORAL | Status: DC | PRN

## 2014-04-26 MED ORDER — POTASSIUM CHLORIDE CRYS ER 20 MEQ PO TBCR
40.0000 meq | EXTENDED_RELEASE_TABLET | Freq: Once | ORAL | Status: AC
  Administered 2014-04-26: 40 meq via ORAL
  Filled 2014-04-26: qty 2

## 2014-04-26 MED ORDER — METOPROLOL SUCCINATE ER 50 MG PO TB24: 50.0000 mg | ORAL_TABLET | Freq: Every day | ORAL | Status: DC

## 2014-04-26 NOTE — Progress Notes (Signed)
Physical Therapy Treatment Patient Details Name: Joban Colledge MRN: [id number] DOB: December 10, 1950 Today's Date: 04/26/2014    History of Present Illness 63 yo male admitted with ETOH withdrawal, neutropenia, chest pain, HTN. Hx of ETOH abuse    PT Comments    Progressing well with mobility. Pt states he is ready to d/c home.   Follow Up Recommendations  No PT follow up;Supervision - Intermittent     Equipment Recommendations  None recommended by PT    Recommendations for Other Services       Precautions / Restrictions Precautions Precautions: Fall Restrictions Weight Bearing Restrictions: No    Mobility  Bed Mobility Overal bed mobility: Modified Independent                Transfers Overall transfer level: Needs assistance   Transfers: Sit to/from Stand Sit to Stand: Supervision            Ambulation/Gait Ambulation/Gait assistance: Supervision Ambulation Distance (Feet): 800 Feet Assistive device: None (IV pole) Gait Pattern/deviations: Decreased stride length     General Gait Details: Initially used IV pole for support however pt was able to progress to no UE support. tolerated activity well.    Stairs            Wheelchair Mobility    Modified Rankin (Stroke Patients Only)       Balance Overall balance assessment: Needs assistance           Standing balance-Leahy Scale: Good               High level balance activites: Side stepping;Backward walking;Direction changes;Turns;Sudden stops High Level Balance Comments: Min guard assist for balance tasks. No LOB but increased time.     Cognition Arousal/Alertness: Awake/alert Behavior During Therapy: Flat affect Overall Cognitive Status: Within Functional Limits for tasks assessed                      Exercises General Exercises - Lower Extremity Heel Raises: AROM;Both;10 reps Mini-Sqauts: AROM;Both;10 reps    General Comments        Pertinent Vitals/Pain  Pain Assessment: No/denies pain    Home Living                      Prior Function            PT Goals (current goals can now be found in the care plan section) Progress towards PT goals: Progressing toward goals    Frequency  Min 3X/week    PT Plan Current plan remains appropriate    Co-evaluation             End of Session Equipment Utilized During Treatment: Gait belt Activity Tolerance: Patient tolerated treatment well Patient left: in chair;with call bell/phone within reach;with chair alarm set     Time: 9811-9147 PT Time Calculation (min): 25 min  Charges:  $Gait Training: 23-37 mins                    G Codes:      Weston Anna, MPT Pager: [phone]

## 2014-04-26 NOTE — Discharge Summary (Signed)
Physician Discharge Summary  Elester Apodaca YWV:[id number] DOB: Jun 25, 1951 DOA: 04/23/2014  PCP: [name], MD  Admit date: 04/23/2014 Discharge date: 04/26/2014  Recommendations for Outpatient Follow-up:  1. Pt will need to follow up with PCP in 2-3 weeks post discharge 2. Please obtain CMP to evaluate electrolytes and kidney function, liver function, potassium 3. Pt given one dose of K-dur 40 MEQ prior to discharge  4. Please also check CBC to evaluate Hg and Hct levels 5. Pt wants to be discharged today   Discharge Diagnoses:  Active Problems:   Alcohol withdrawal   Chest pain   Alcohol abuse with intoxication   Elevated blood pressure   Tobacco use disorder   Transaminitis  Discharge Condition: Stable  Diet recommendation: Heart healthy diet discussed in details   Brief narrative:  63 year old male with history of alcohol abuse was brought to the hospital by his wife for heavy alcohol use and intoxication and complain of substernal chest pain since the am of admission, associated with shakes, tremors, non bloody vomiting, poor oral intake.Pt reports being hospitalized in [place] about 6 years back for almost 40 days for alcohol withdrawal symptoms. Denies being intubated at that time.   Assessment and Plan:   Assessment/Plan:  Chest pain  - secondary to alcoholic gastritis and GERD  - cardiac enzymes neg X3  - no EKG abnormalities  - symptoms improved with PPI and carafate  - pt denies any chest pain this AM and wants to go hom e Elevated blood pressure: no prior hx of HTN  - Likely secondary to early alcohol withdrawal and undiagnosed HTN  - continue toprol and hydralazine  Alcohol abuse  - counseled on cessation  - scored ) on CIWA, wants to go home  Transaminitis  - Likely secondary to alcoholic hepatitis.  - abd US demonstrated liver steatosis - repeat CMET in an outpatient setting  Neutropenia  - secondary to alcohol induced bone marrow damage   Tobacco abuse  - Counseled on cessation. Continue nicotine patch  Off balance  - due to alcohol most likely  - PT evaluation requested   Code Status: Full  Family Communication: Pt at bedside  Disposition Plan: Home when medically stable   IV Access:   Peripheral IV Procedures and diagnostic studies:   US Abdomen Complete 04/24/2014 No acute findings. Hepatic steatosis. No liver mass. No other abnormalities.  Medical Consultants:   None  Other Consultants:   Physical therapy  Anti-Infectives:   None   Discharge Exam: Filed Vitals:   04/26/14 0617  BP: 141/93  Pulse: 87  Temp: 98.1 F (36.7 C)  Resp: 20   Filed Vitals:   04/25/14 1406 04/25/14 1800 04/25/14 2201 04/26/14 0617  BP: 161/104 148/105 156/83 141/93  Pulse: 94  95 87  Temp: 98.1 F (36.7 C)  98.3 F (36.8 C) 98.1 F (36.7 C)  TempSrc: Oral  Oral Oral  Resp: 22  20 20   Height:      Weight:      SpO2: 97%  98% 95%    General: Pt is alert, follows commands appropriately, not in acute distress Cardiovascular: Regular rate and rhythm, S1/S2 +, no murmurs, no rubs, no gallops Respiratory: Clear to auscultation bilaterally, no wheezing, no crackles, no rhonchi Abdominal: Soft, non tender, non distended, bowel sounds +, no guarding  Discharge Instructions  Discharge Instructions   Diet - low sodium heart healthy    Complete by:  As directed      Increase activity  slowly    Complete by:  As directed             Medication List         clonazePAM 0.5 MG tablet  Commonly known as:  KLONOPIN  Take 1 tablet (0.5 mg total) by mouth 3 (three) times daily as needed for anxiety.     famotidine 20 MG tablet  Commonly known as:  PEPCID  Take 1 tablet (20 mg total) by mouth 2 (two) times daily.     Fish Oil 1000 MG Caps  Take 2 capsules by mouth daily.     hydrALAZINE 10 MG tablet  Commonly known as:  APRESOLINE  Take 1 tablet (10 mg total) by mouth every 8 (eight) hours.     hydrOXYzine 25 MG  tablet  Commonly known as:  ATARAX/VISTARIL  Take 25 mg by mouth 3 (three) times daily as needed for itching.     ibuprofen 200 MG tablet  Commonly known as:  ADVIL,MOTRIN  Take 400 mg by mouth every 6 (six) hours as needed for moderate pain.     metoprolol succinate 50 MG 24 hr tablet  Commonly known as:  TOPROL-XL  Take 1 tablet (50 mg total) by mouth daily.     multivitamin with minerals Tabs tablet  Take 1 tablet by mouth daily.     tadalafil 5 MG tablet  Commonly known as:  CIALIS  Take 1 tablet (5 mg total) by mouth daily as needed for erectile dysfunction. Take 1 tablet 5 days per week     thiamine 100 MG tablet  Commonly known as:  VITAMIN B-1  Take 100 mg by mouth daily.     triamcinolone cream 0.1 %  Commonly known as:  KENALOG  Apply 1 application topically 2 (two) times daily as needed (rash / irritaion).     vitamin C 500 MG tablet  Commonly known as:  ASCORBIC ACID  Take 500 mg by mouth daily.           Follow-up Information   Follow up with Faye Ramsay, MD.   Specialty:  Internal Medicine   Contact information:   [address] [place] [address] [phone]       Schedule an appointment as soon as possible for a visit with [name], MD.   Specialty:  Internal Medicine   Contact information:   [place] [address] [phone]        The results of significant diagnostics from this hospitalization (including imaging, microbiology, ancillary and laboratory) are listed below for reference.     Microbiology: No results found for this or any previous visit (from the past 240 hour(s)).   Labs: Basic Metabolic Panel:  Recent Labs Lab 04/23/14 0844 04/25/14 0330 04/26/14 0345  NA 140 140 139  K 4.2 3.8 3.4*  CL 96 102 105  CO2 25 23 22   GLUCOSE 111* 108* 100*  BUN 10 11 14   CREATININE 0.91 0.87 0.84  CALCIUM 9.0 9.0 8.8   Liver Function Tests:  Recent Labs Lab  04/23/14 0844 04/26/14 0345  AST 298* 342*  ALT 182* 246*  ALKPHOS 82 82  BILITOT 0.8 0.5  PROT 8.5* 7.1  ALBUMIN 3.8 3.0*   No results found for this basename: LIPASE, AMYLASE,  in the last 168 hours No results found for this basename: AMMONIA,  in the last 168 hours CBC:  Recent Labs Lab 04/23/14 0844 04/25/14 0330 04/26/14 0345  WBC 3.0* 3.5* 4.9  NEUTROABS 1.0*  --   --   HGB 16.6 15.3 14.6  HCT 48.9 45.5 43.8  MCV 95.3 93.4 95.4  PLT 230 209 198   Cardiac Enzymes:  Recent Labs Lab 04/23/14 0844 04/23/14 1825 04/23/14 2306 04/24/14 0515  TROPONINI <0.30 <0.30 <0.30 <0.30   SIGNED: Time coordinating discharge: Over 30 minutes  Faye Ramsay, MD  [facility] 04/26/2014, 9:42 AM Pager [phone]  If 7PM-7AM, please contact night-coverage [email or web address] Password [email or web address]

## 2014-04-26 NOTE — Discharge Instructions (Signed)

## 2014-05-28 ENCOUNTER — Other Ambulatory Visit: Payer: Self-pay | Admitting: Internal Medicine

## 2014-05-29 ENCOUNTER — Other Ambulatory Visit: Payer: Self-pay | Admitting: Internal Medicine

## 2014-05-31 ENCOUNTER — Telehealth: Payer: Self-pay

## 2014-05-31 NOTE — Telephone Encounter (Signed)
Refill Request: Clonazepam 0.5mg  tablet- Take 1 tablet by mouth 3 times a day as needed for anxiety  [facility]

## 2014-05-31 NOTE — Telephone Encounter (Signed)
Pls advise.  

## 2014-06-01 NOTE — Telephone Encounter (Signed)
Can you please advise and help this patient set up follow up with his PCP?  If he has been on this medication on a regular basis and is concerned we won't refill, ok to give limited quantity #15, no refills, to use prn for anxiety not more then tid to get to PCP visit. Please let him know all further refills must come from PCP. Thanks.

## 2014-06-01 NOTE — Telephone Encounter (Signed)
I spoke with Mr Vosler and he states he usually only takes one a day. He was agreeable to #15 of the Clonazepam being [facility] [address]. I have him scheduled to see Dr Raliegh Ip on 12/21 at 11:00am. He knows he needs to keep this appt in order to receive any additional medication.

## 2014-06-01 NOTE — Telephone Encounter (Signed)
Called and spoke with pt and pt states he went to [employer] meetings.  Pt states Dr. Raliegh Ip told himhe did not need to come in he felt he didn't need to.  Pt is aware of Dr. Julianne Rice recommendations.

## 2014-06-01 NOTE — Telephone Encounter (Signed)
Per review of PCP notes was to follow up with behavioral health and with PCP 1 week after appointment in 11/2013 and was on limited quantity of this. Needs appt. No refills. ED if withdrawal or significant anxiety and not seeing psychiatrist.

## 2014-06-18 ENCOUNTER — Ambulatory Visit: Payer: 59 | Admitting: Internal Medicine

## 2014-06-30 ENCOUNTER — Other Ambulatory Visit: Payer: Self-pay | Admitting: Internal Medicine

## 2014-07-01 ENCOUNTER — Other Ambulatory Visit: Payer: Self-pay | Admitting: Internal Medicine

## 2014-07-02 ENCOUNTER — Encounter: Payer: Self-pay | Admitting: Internal Medicine

## 2014-07-02 ENCOUNTER — Ambulatory Visit (INDEPENDENT_AMBULATORY_CARE_PROVIDER_SITE_OTHER): Payer: 59 | Admitting: Internal Medicine

## 2014-07-02 VITALS — BP 138/90 | HR 71 | Temp 98.0°F | Resp 20 | Ht 70.0 in | Wt 217.0 lb

## 2014-07-02 DIAGNOSIS — R972 Elevated prostate specific antigen [PSA]: Secondary | ICD-10-CM

## 2014-07-02 DIAGNOSIS — F1020 Alcohol dependence, uncomplicated: Secondary | ICD-10-CM

## 2014-07-02 DIAGNOSIS — J3089 Other allergic rhinitis: Secondary | ICD-10-CM

## 2014-07-02 DIAGNOSIS — I10 Essential (primary) hypertension: Secondary | ICD-10-CM

## 2014-07-02 DIAGNOSIS — J4530 Mild persistent asthma, uncomplicated: Secondary | ICD-10-CM

## 2014-07-02 LAB — COMPREHENSIVE METABOLIC PANEL
ALBUMIN: 4.1 g/dL (ref 3.5–5.2)
ALK PHOS: 75 U/L (ref 39–117)
ALT: 32 U/L (ref 0–53)
AST: 35 U/L (ref 0–37)
BILIRUBIN TOTAL: 0.5 mg/dL (ref 0.2–1.2)
BUN: 17 mg/dL (ref 6–23)
CO2: 29 mEq/L (ref 19–32)
Calcium: 9.4 mg/dL (ref 8.4–10.5)
Chloride: 103 mEq/L (ref 96–112)
Creatinine, Ser: 1.1 mg/dL (ref 0.4–1.5)
GFR: 69.39 mL/min (ref >60.00)
Glucose, Bld: 88 mg/dL (ref 70–99)
Potassium: 4.3 mEq/L (ref 3.5–5.1)
SODIUM: 140 meq/L (ref 135–145)
Total Protein: 8.2 g/dL (ref 6.0–8.3)

## 2014-07-02 LAB — CBC WITH DIFFERENTIAL/PLATELET
BASOS ABS: 0 10*3/uL (ref 0.0–0.1)
Basophils Relative: 0.7 % (ref 0.0–3.0)
EOS ABS: 0.1 10*3/uL (ref 0.0–0.7)
Eosinophils Relative: 1.7 % (ref 0.0–5.0)
HCT: 50.6 % (ref 39.0–52.0)
HEMOGLOBIN: 16.3 g/dL (ref 13.0–17.0)
LYMPHS ABS: 2.3 10*3/uL (ref 0.7–4.0)
Lymphocytes Relative: 39.5 % (ref 12.0–46.0)
MCHC: 32.2 g/dL (ref 30.0–36.0)
MCV: 97.8 fl (ref 78.0–100.0)
MONO ABS: 0.8 10*3/uL (ref 0.1–1.0)
MONOS PCT: 14.3 % — AB (ref 3.0–12.0)
NEUTROS ABS: 2.5 10*3/uL (ref 1.4–7.7)
Neutrophils Relative %: 43.8 % (ref 43.0–77.0)
PLATELETS: 261 10*3/uL (ref 150.0–400.0)
RBC: 5.17 Mil/uL (ref 4.22–5.81)
RDW: 14.8 % (ref 11.5–15.5)
WBC: 5.8 10*3/uL (ref 4.0–10.5)

## 2014-07-02 MED ORDER — HYDRALAZINE HCL 10 MG PO TABS: 10.0000 mg | ORAL_TABLET | Freq: Three times a day (TID) | ORAL | Status: DC

## 2014-07-02 MED ORDER — TADALAFIL 5 MG PO TABS: 5.0000 mg | ORAL_TABLET | Freq: Every day | ORAL | Status: DC | PRN

## 2014-07-02 MED ORDER — TADALAFIL 5 MG PO TABS: 5.0000 mg | ORAL_TABLET | Freq: Every day | ORAL | Status: AC | PRN

## 2014-07-02 NOTE — Progress Notes (Signed)
Pre visit review using our clinic review tool, if applicable. No additional management support is needed unless otherwise documented below in the visit note. 

## 2014-07-02 NOTE — Progress Notes (Signed)
Subjective:    Patient ID: [name], male    DOB: 1950/07/12, 64 y.o.   MRN: [id number]  HPI 64 year old patient who has a history of chronic alcoholism.  He relapsed and was hospitalized in October for detoxification.  He does participated in [place] and has a sponsor and has been abstinent since his last hospital admission.  He continues to work and hopes to retire at age 66.  He does have considerable situational stressors.  His son died in 2013/08/27.  He has responsibilities with the 2 daughters and grandchildren.  He is divorced.  He is followed by urology for prostate cancer.  Past Medical History  Diagnosis Date  . ALLERGIC RHINITIS 04/19/2009  . ASTHMA 04/19/2009  . COLONIC POLYPS, HX OF 04/19/2009  . OSTEOARTHRITIS 04/19/2009  . Eczema     History   Social History  . Marital Status: Divorced    Spouse Name: N/A    Number of Children: N/A  . Years of Education: N/A   Occupational History  . Not on file.   Social History Main Topics  . Smoking status: Former Smoker -- 1.00 packs/day    Quit date: 07/03/2011  . Smokeless tobacco: Never Used  . Alcohol Use: No  . Drug Use: No  . Sexual Activity: No   Other Topics Concern  . Not on file   Social History Narrative    Past Surgical History  Procedure Laterality Date  . Joint replacement      left hip  . Prostatectomy      September 14, 2013    No family history on file.  Allergies  Allergen Reactions  . Horse-Derived Products Anaphylaxis  . Meperidine Nausea And Vomiting    "patient states that he felt like he was blacking out and couldn't wake up.    Current Outpatient Prescriptions on File Prior to Visit  Medication Sig Dispense Refill  . famotidine (PEPCID) 20 MG tablet Take 1 tablet (20 mg total) by mouth 2 (two) times daily. 60 tablet 1  . hydrALAZINE (APRESOLINE) 10 MG tablet Take 1 tablet (10 mg total) by mouth every 8 (eight) hours. 90 tablet 1  . hydrOXYzine (ATARAX/VISTARIL) 25 MG tablet Take  25 mg by mouth 3 (three) times daily as needed for itching.    Marland Kitchen ibuprofen (ADVIL,MOTRIN) 200 MG tablet Take 400 mg by mouth every 6 (six) hours as needed for moderate pain.    . metoprolol succinate (TOPROL-XL) 50 MG 24 hr tablet Take 1 tablet (50 mg total) by mouth daily. 30 tablet 1  . Multiple Vitamin (MULTIVITAMIN WITH MINERALS) TABS tablet Take 1 tablet by mouth daily.    . Omega-3 Fatty Acids (FISH OIL) 1000 MG CAPS Take 2 capsules by mouth daily.    Marland Kitchen PROAIR HFA 108 (90 BASE) MCG/ACT inhaler INHALE 2 PUFFS INTO THE LUNGS EVERY 6 HOURS AS NEEDED FOR WHEEZING 8.5 each 2  . tadalafil (CIALIS) 5 MG tablet Take 1 tablet (5 mg total) by mouth daily as needed for erectile dysfunction. Take 1 tablet 5 days per week 10 tablet 0  . thiamine (VITAMIN B-1) 100 MG tablet Take 100 mg by mouth daily.    Marland Kitchen triamcinolone cream (KENALOG) 0.1 % Apply 1 application topically 2 (two) times daily as needed (rash / irritaion).     . vitamin C (ASCORBIC ACID) 500 MG tablet Take 500 mg by mouth daily.    . clonazePAM (KLONOPIN) 0.5 MG tablet Take 1 tablet (0.5 mg total)  by mouth 3 (three) times daily as needed for anxiety. (Patient not taking: Reported on 07/02/2014) 15 tablet 0   No current facility-administered medications on file prior to visit.    BP 138/90 mmHg  Pulse 71  Temp(Src) 98 F (36.7 C) (Oral)  Resp 20  Ht 5\' 10"  (1.778 m)  Wt 217 lb (98.431 kg)  BMI 31.14 kg/m2  SpO2 96%      Review of Systems  Constitutional: Negative for fever, chills, appetite change and fatigue.  HENT: Negative for congestion, dental problem, ear pain, hearing loss, sore throat, tinnitus, trouble swallowing and voice change.   Eyes: Negative for pain, discharge and visual disturbance.  Respiratory: Negative for cough, chest tightness, wheezing and stridor.   Cardiovascular: Negative for chest pain, palpitations and leg swelling.  Gastrointestinal: Negative for nausea, vomiting, abdominal pain, diarrhea,  constipation, blood in stool and abdominal distention.  Genitourinary: Negative for urgency, hematuria, flank pain, discharge, difficulty urinating and genital sores.  Musculoskeletal: Negative for myalgias, back pain, joint swelling, arthralgias, gait problem and neck stiffness.  Skin: Negative for rash.  Neurological: Negative for dizziness, syncope, speech difficulty, weakness, numbness and headaches.  Hematological: Negative for adenopathy. Does not bruise/bleed easily.  Psychiatric/Behavioral: Negative for behavioral problems and dysphoric mood. The patient is nervous/anxious.        Objective:   Physical Exam  Constitutional: He is oriented to person, place, and time. He appears well-developed.  Blood pressure 140/82  HENT:  Head: Normocephalic.  Right Ear: External ear normal.  Left Ear: External ear normal.  Eyes: Conjunctivae and EOM are normal.  Neck: Normal range of motion.  Cardiovascular: Normal rate and normal heart sounds.   Pulmonary/Chest: Breath sounds normal.  Abdominal: Bowel sounds are normal.  Musculoskeletal: Normal range of motion. He exhibits no edema or tenderness.  Neurological: He is alert and oriented to person, place, and time.  Psychiatric: He has a normal mood and affect. His behavior is normal.          Assessment & Plan:    Hypertension Chronic alcoholism History of prostate cancer Allergic rhinitis/asthma, stable  Medications updated.  Patient has been off [place] for 2 months and has done well without this medication.  We'll discontinue Recheck 6 months

## 2014-07-02 NOTE — Patient Instructions (Signed)
Limit your sodium (Salt) intake  Please check your blood pressure on a regular basis.  If it is consistently greater than 150/90, please make an office appointment.    It is important that you exercise regularly, at least 20 minutes 3 to 4 times per week.  If you develop chest pain or shortness of breath seek  medical attention.  Return in 6 months for follow-up  

## 2014-07-03 ENCOUNTER — Telehealth: Payer: Self-pay | Admitting: Internal Medicine

## 2014-07-03 NOTE — Telephone Encounter (Signed)
emmi mailed  °

## 2014-07-14 ENCOUNTER — Other Ambulatory Visit: Payer: Self-pay | Admitting: Internal Medicine

## 2014-07-27 ENCOUNTER — Other Ambulatory Visit: Payer: Self-pay | Admitting: Internal Medicine

## 2014-07-28 ENCOUNTER — Other Ambulatory Visit: Payer: Self-pay | Admitting: Internal Medicine

## 2014-08-06 ENCOUNTER — Other Ambulatory Visit: Payer: Self-pay | Admitting: Internal Medicine

## 2014-11-09 ENCOUNTER — Other Ambulatory Visit: Payer: Self-pay | Admitting: Internal Medicine

## 2014-12-09 ENCOUNTER — Other Ambulatory Visit: Payer: Self-pay | Admitting: Internal Medicine

## 2015-01-17 ENCOUNTER — Other Ambulatory Visit: Payer: Self-pay | Admitting: *Deleted

## 2015-01-17 MED ORDER — ALBUTEROL SULFATE HFA 108 (90 BASE) MCG/ACT IN AERS: INHALATION_SPRAY | RESPIRATORY_TRACT | Status: DC

## 2015-01-29 ENCOUNTER — Telehealth: Payer: Self-pay | Admitting: Internal Medicine

## 2015-01-29 MED ORDER — HYDRALAZINE HCL 10 MG PO TABS: 10.0000 mg | ORAL_TABLET | Freq: Three times a day (TID) | ORAL | Status: DC

## 2015-01-29 NOTE — Telephone Encounter (Signed)
Pt request refill of the following: hydrALAZINE (APRESOLINE) 10 MG tablet   Phamacy: [facility]

## 2015-01-29 NOTE — Telephone Encounter (Signed)
Spoke to pt, told him Rx sent to pharmacy. Also told him over due for physical, need to call and schedule. Pt verbalized understanding.

## 2015-05-25 ENCOUNTER — Other Ambulatory Visit: Payer: Self-pay | Admitting: Internal Medicine

## 2015-06-01 IMAGING — US US SCROTUM
1 series · 14 of 25 positions shown · non-contrast
Comparison: None.

CLINICAL DATA: Bilateral testicles swelling, rule out torsion

EXAM:
SCROTAL ULTRASOUND
DOPPLER ULTRASOUND OF THE TESTICLES
TECHNIQUE: Complete ultrasound examination of the testicles, epididymis, and
other scrotal structures was performed. Color and spectral Doppler
ultrasound were also utilized to evaluate blood flow to the
testicles.

[Series 1: us scrotum · 0.06mm/px · 14 of 45 slices shown]
[im 1/45]
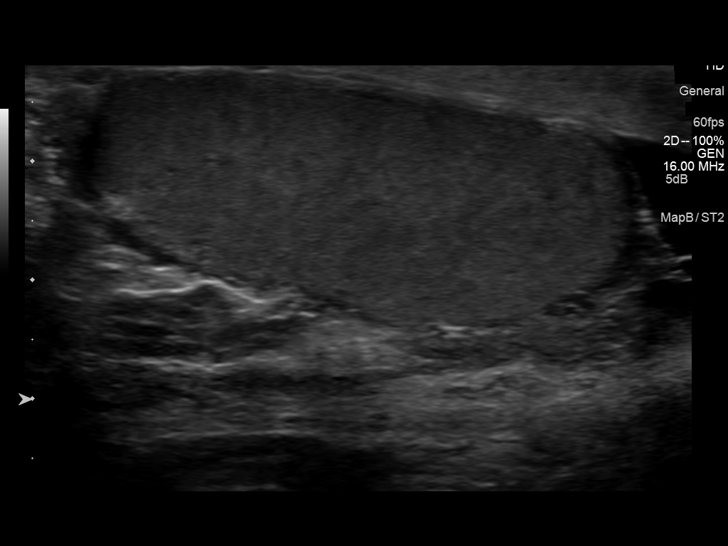
[im 4/45]
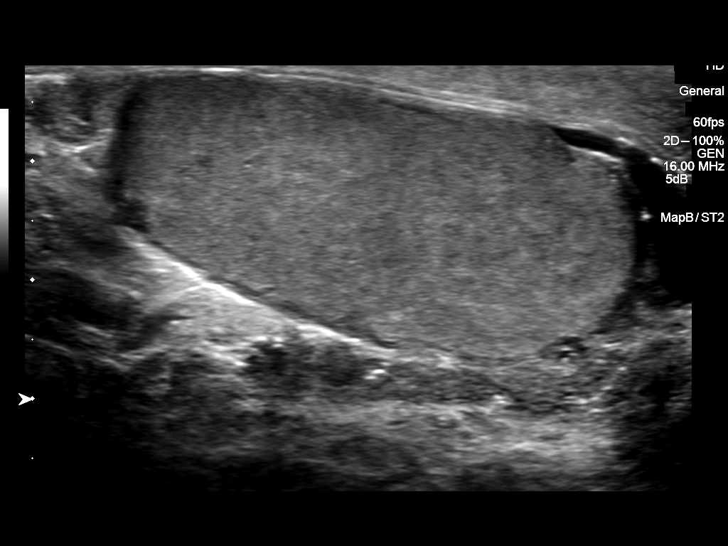
[im 8/45]
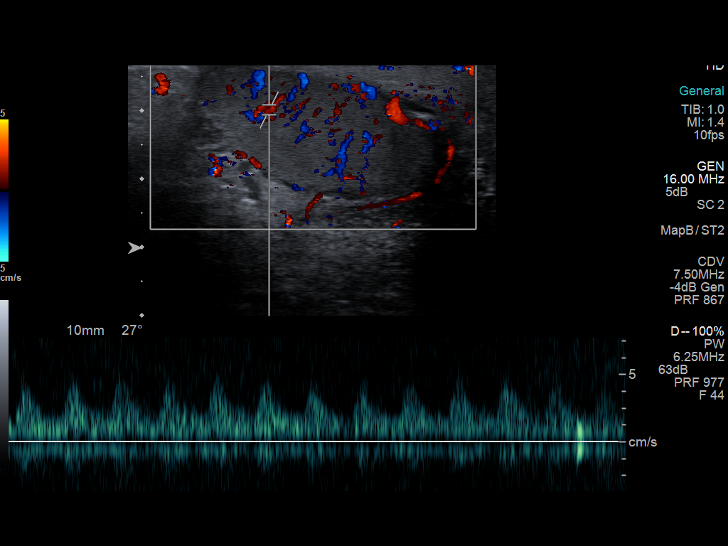
[im 12/45]
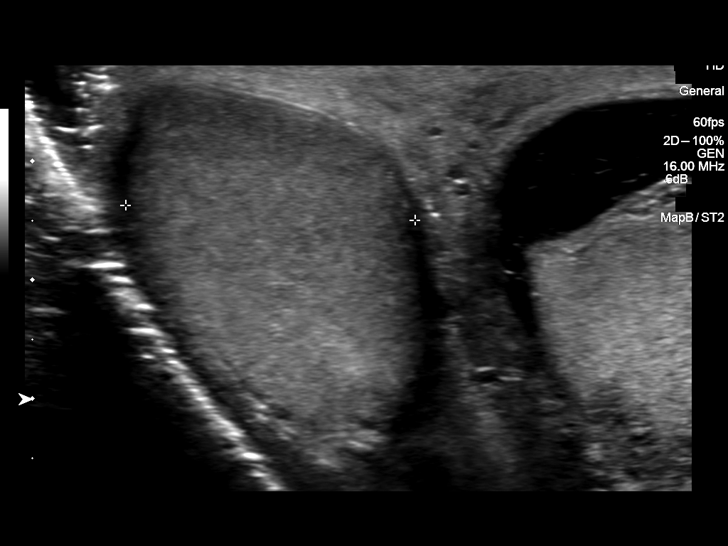
[im 15/45]
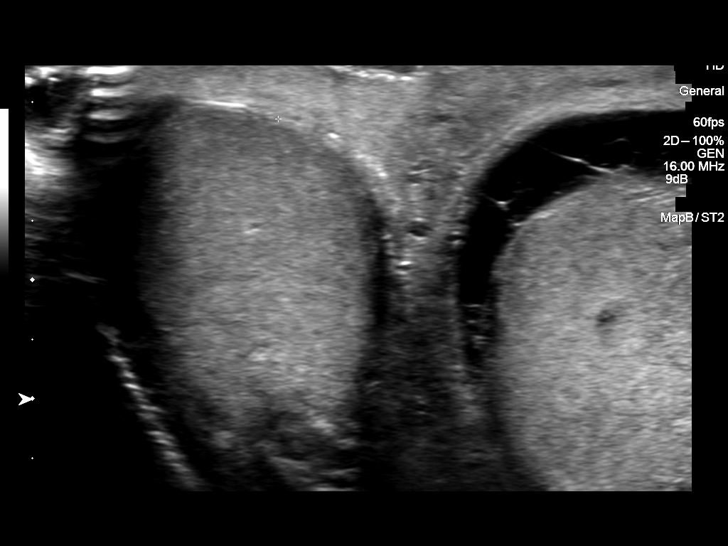
[im 17/45]
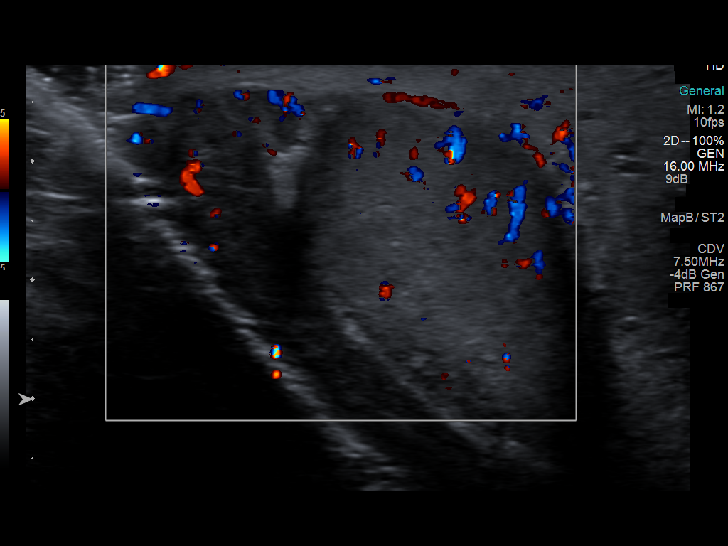
[im 21/45]
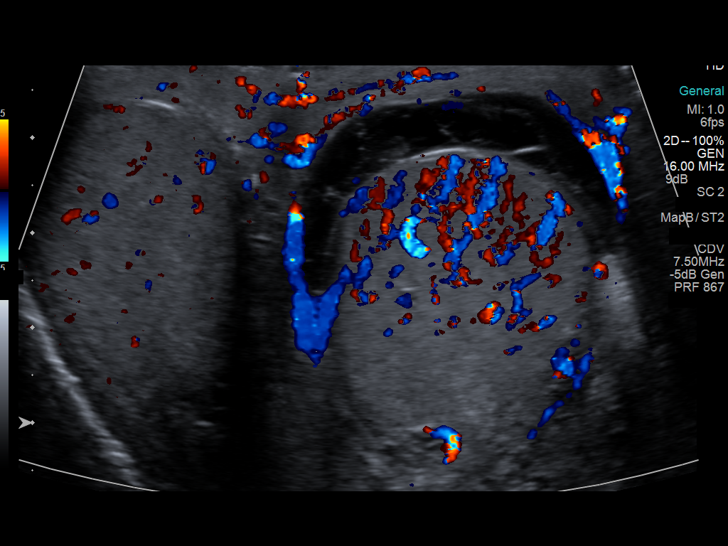
[im 24/45]
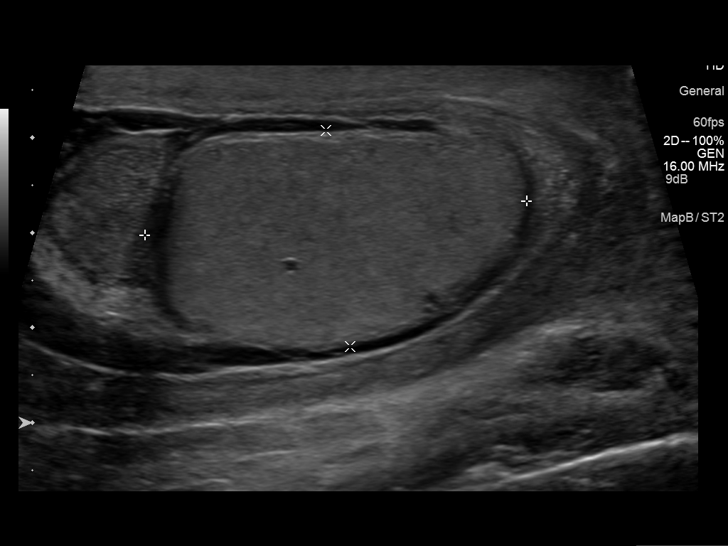
[im 28/45]
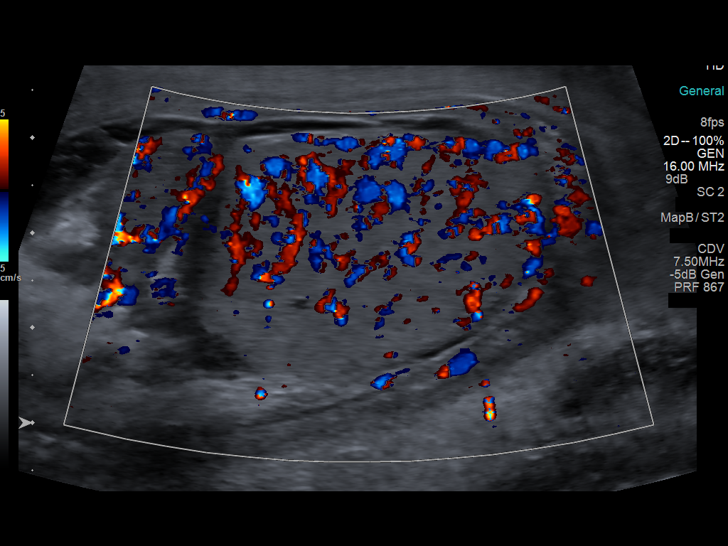
[im 30/45]
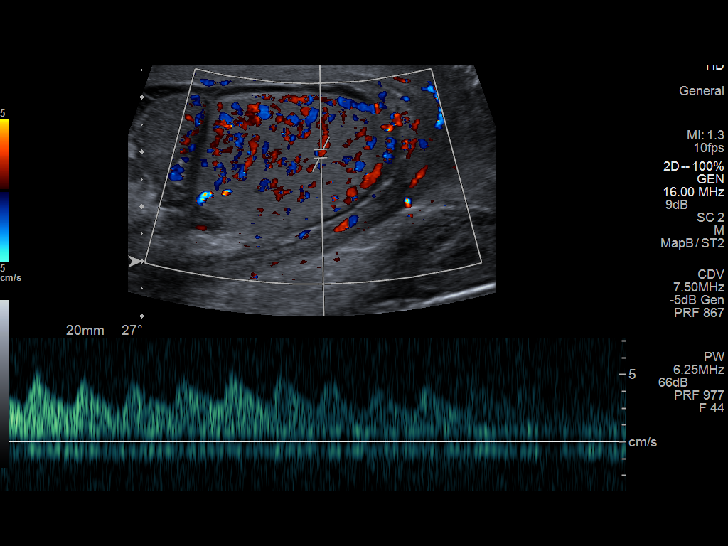
[im 34/45]
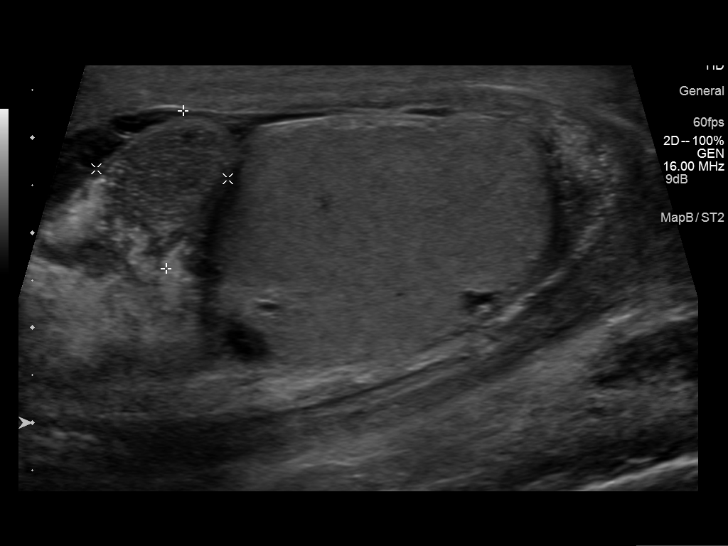
[im 37/45]
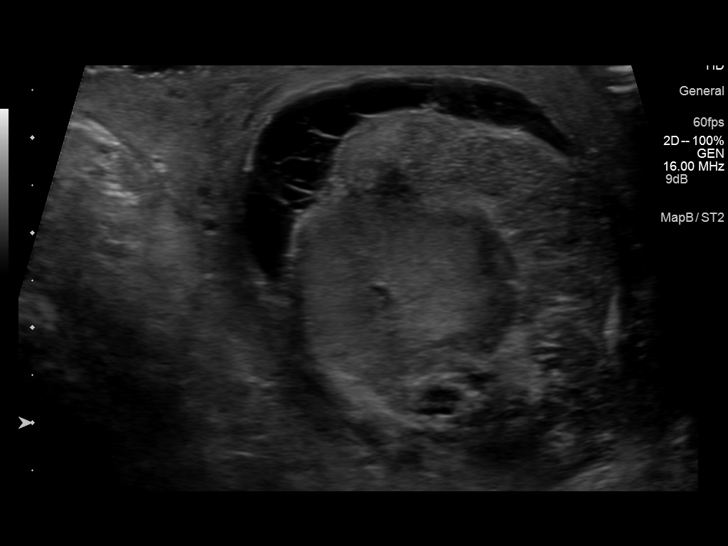
[im 41/45]
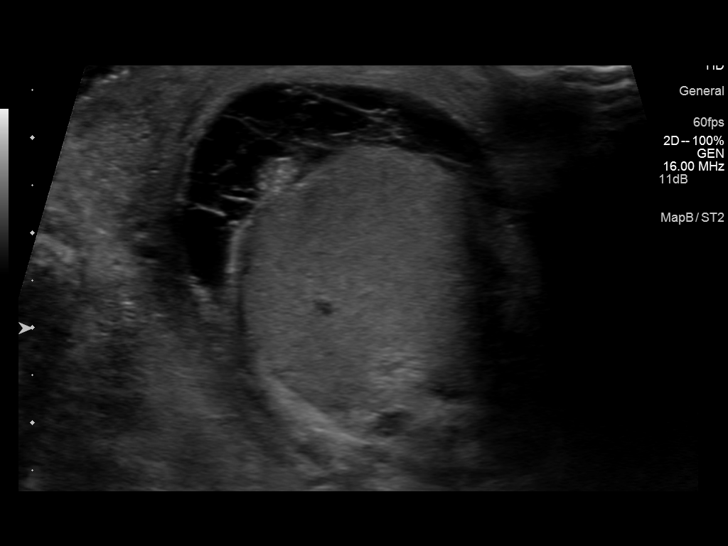
[im 45/45]
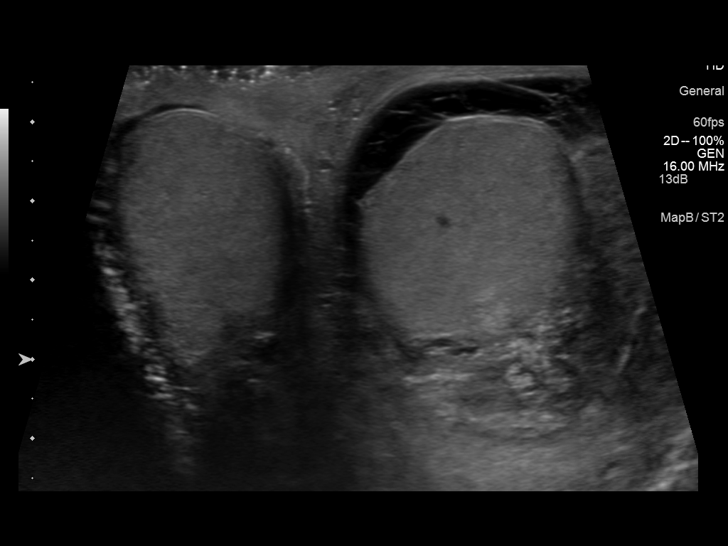

[14 of 25 positions shown; findings below may reference images not displayed]

FINDINGS: Right testicle

Measurements: 47 x 19 x 24 mm. No mass or microlithiasis visualized.

Left testicle

Measurements: 40 x 23 x 16 mm. No mass or microlithiasis visualized.

Right epididymis:  Normal in size and appearance.

Left epididymis:  Mildly enlarged and hypervascular

Hydrocele: Tiny right hydrocele. Large complex left hydrocele with
multiple septations.

Varicocele:  None visualized.

Pulsed Doppler interrogation of both testes demonstrates increased
blood flow to both testicles.
IMPRESSION: Findings consistent with bilateral orchitis and left epididymitis,
with large complex hydrocele left scrotum.

## 2015-06-01 IMAGING — CR DG PELVIS 1-2V
1 series · 1 of 1 positions shown · non-contrast
Comparison: None.

CLINICAL DATA: Groin swelling ; history of prostate malignancy.

EXAM:
PELVIS - 1-2 VIEW

[t pelvis ap]
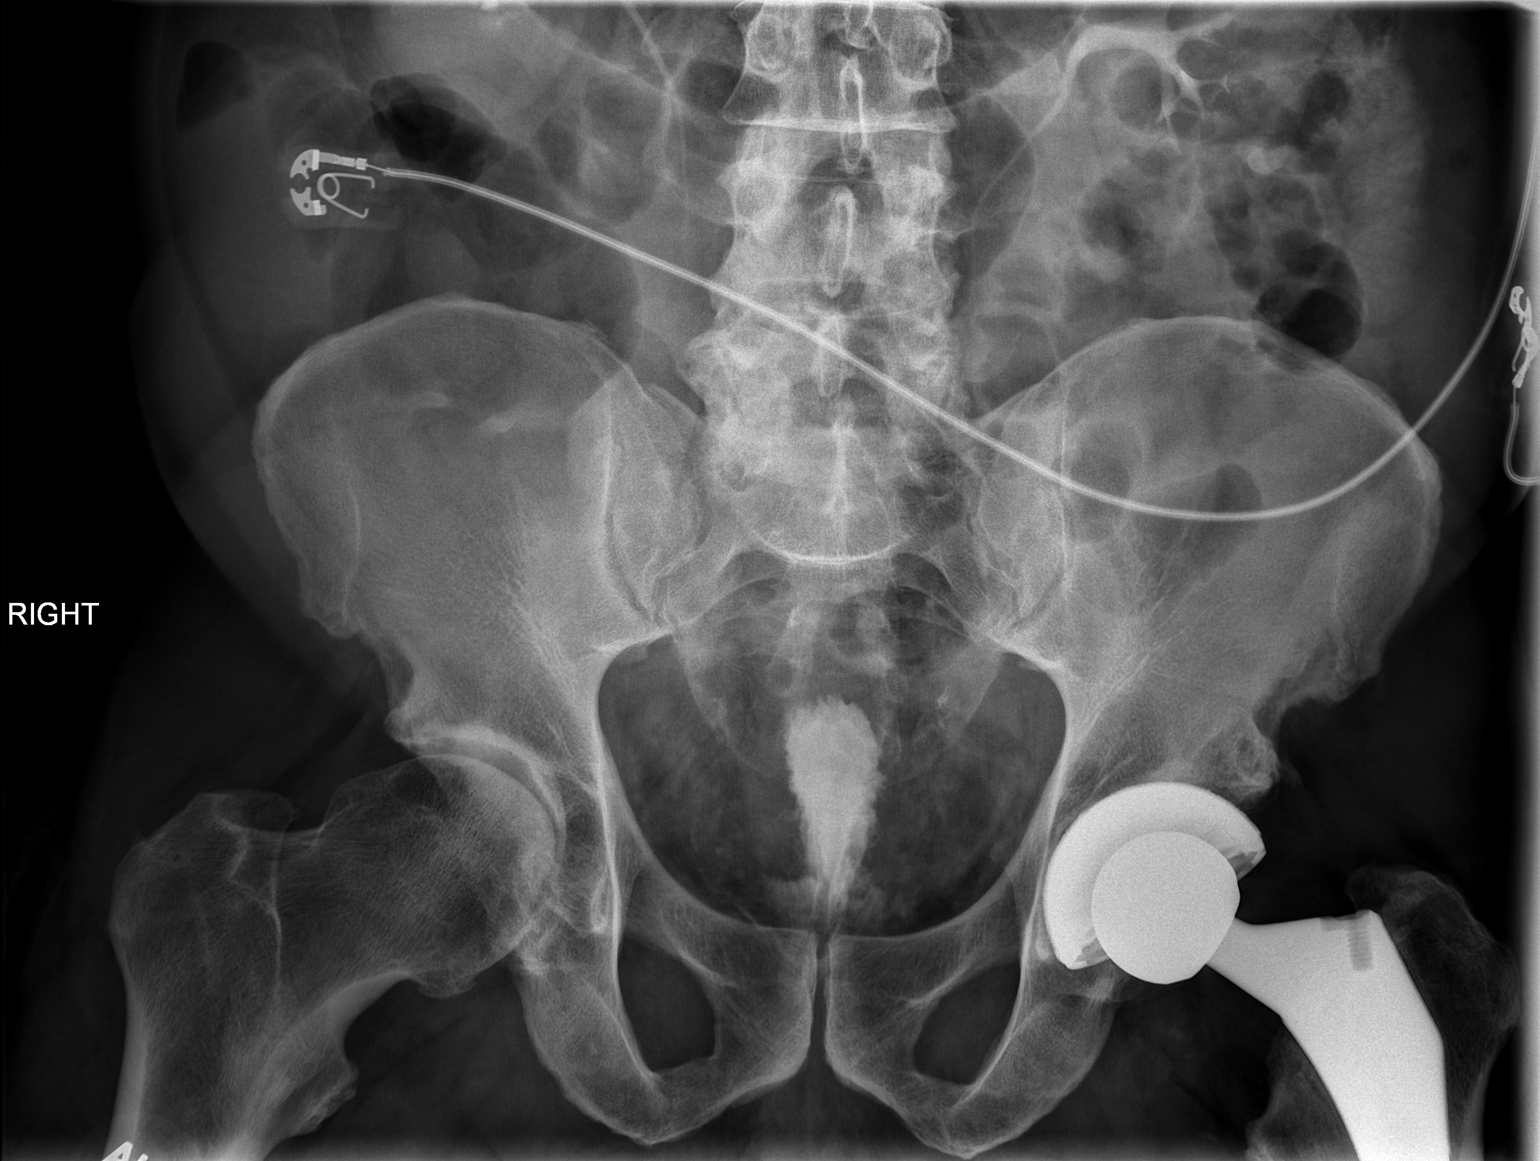

[1 of 1 positions shown; findings below may reference images not displayed]

FINDINGS: The bony pelvis appears adequately mineralized. There is no lytic
nor blastic lesion or evidence of an acute fracture. There is a
prosthetic left hip joint. There are degenerative changes of the
right hip joint. There is contrast in the nondistended urinary
bladder. There may be mass effect upon the lateral aspects of the
urinary bladder. There are degenerative changes of the lower lumbar
spine.
IMPRESSION: There is no acute bony abnormality of the pelvis.

## 2015-06-01 IMAGING — CT CT ABD-PELV W/ CM
1 of 3 series · 13 of 32 positions shown, 18 images · IV contrast (OMNIPAQUE 300)
Comparison: US SCROTUM dated 10/08/2013; CT PELVIS W/CM dated
09/16/2013; CT ABD W/CM dated 02/10/2007

CLINICAL DATA: Increasing left-sided abdominal pain with vomiting
and intermittent hematuria. Recent prostatectomy with subsequent
penile swelling.

EXAM:
CT ABDOMEN AND PELVIS WITH CONTRAST
TECHNIQUE: Multidetector CT imaging of the abdomen and pelvis was performed
using the standard protocol following bolus administration of
intravenous contrast.
CONTRAST:  100mL OMNIPAQUE IOHEXOL 300 MG/ML SOLN, 50mL OMNIPAQUE
IOHEXOL 300 MG/ML SOLN

[Series 2: abd/pel with · axial · 0.84mm/px · z∈[-501,-31]mm · 13 of 106 slices shown, 18 images]
[im 6/106  soft-tissue]
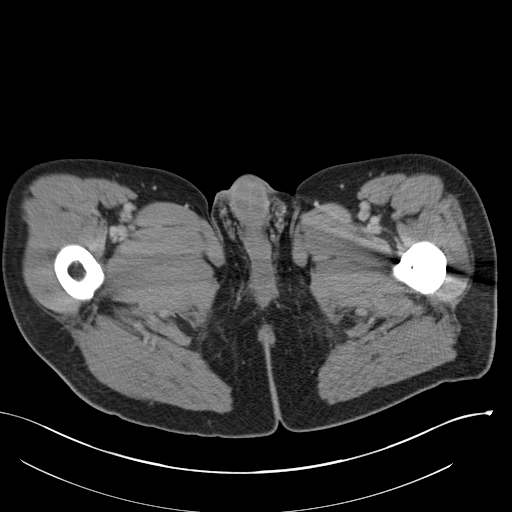
[im 6/106  bone]
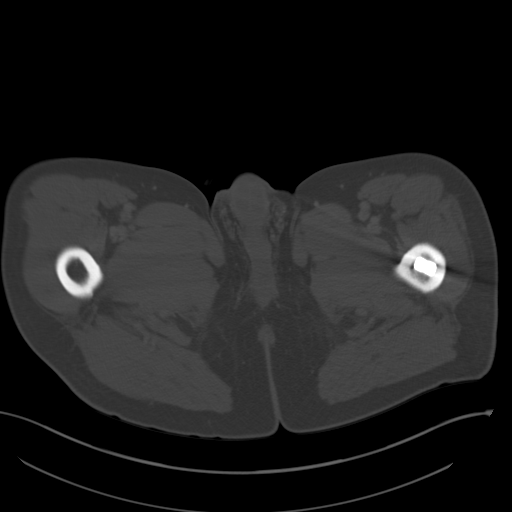
[im 17/106  soft-tissue]
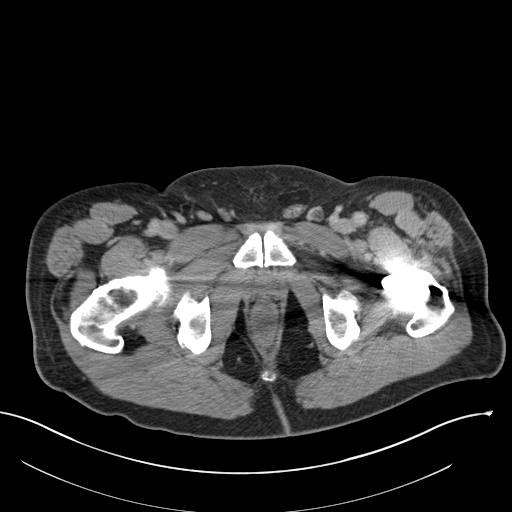
[im 23/106  soft-tissue]
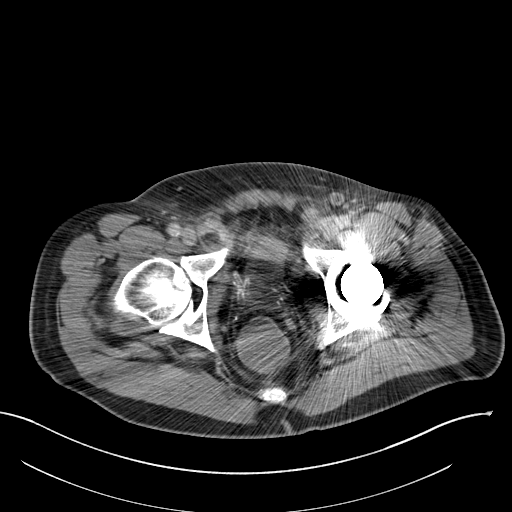
[im 34/106  soft-tissue]
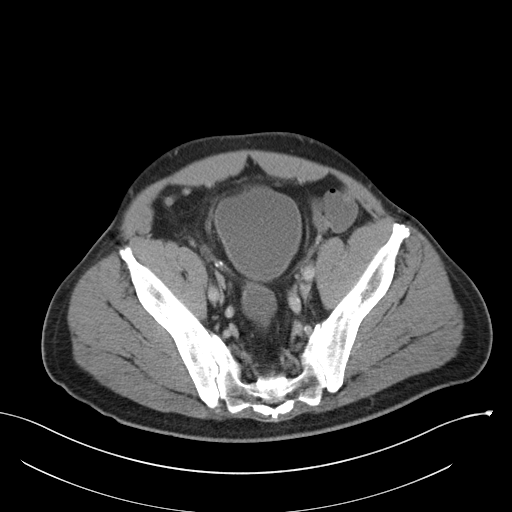
[im 39/106  soft-tissue]
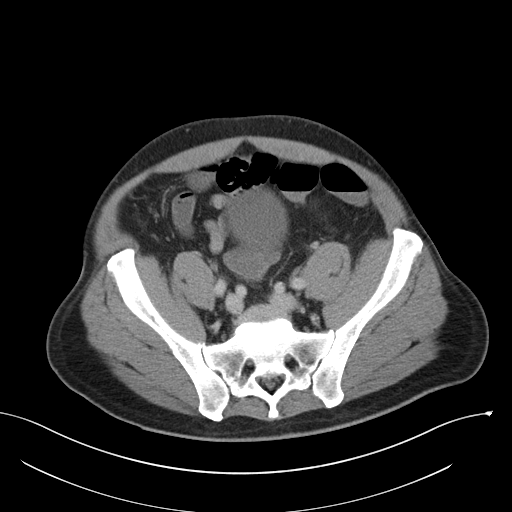
[im 50/106  soft-tissue]
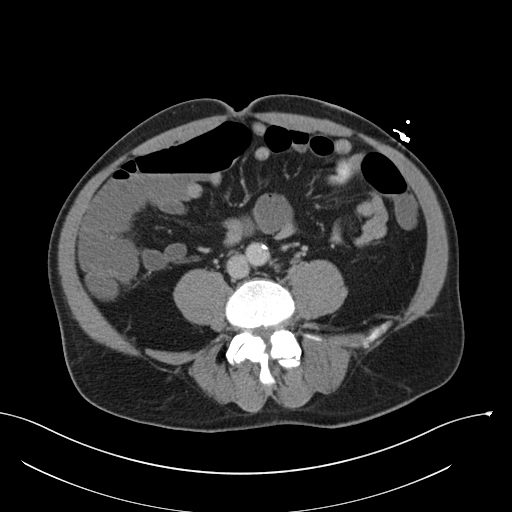
[im 56/106  soft-tissue]
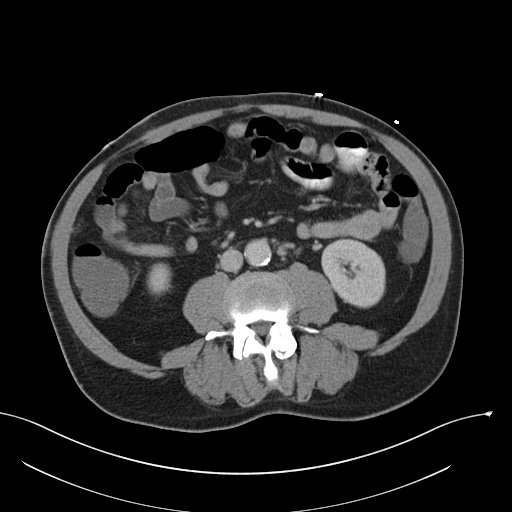
[im 67/106  soft-tissue]
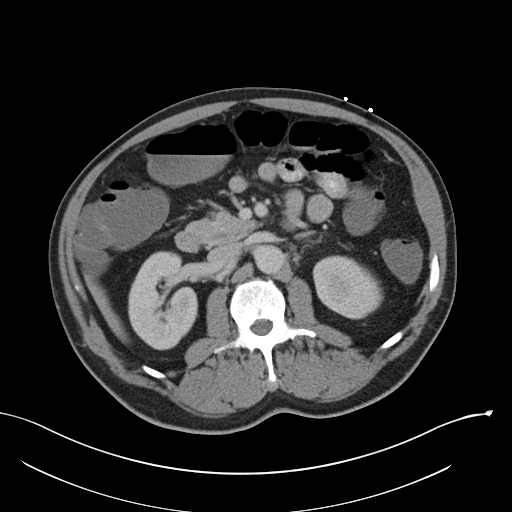
[im 72/106  soft-tissue]
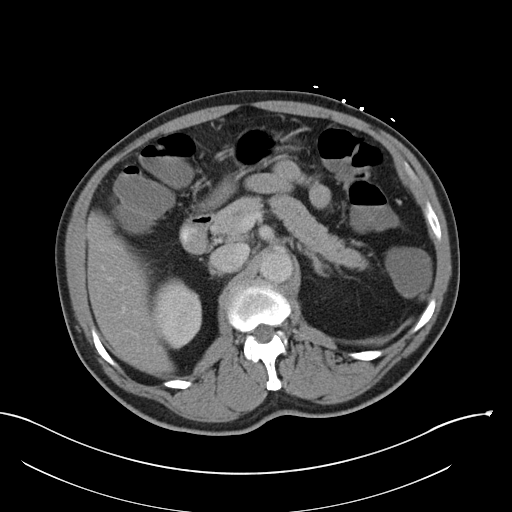
[im 72/106  bone]
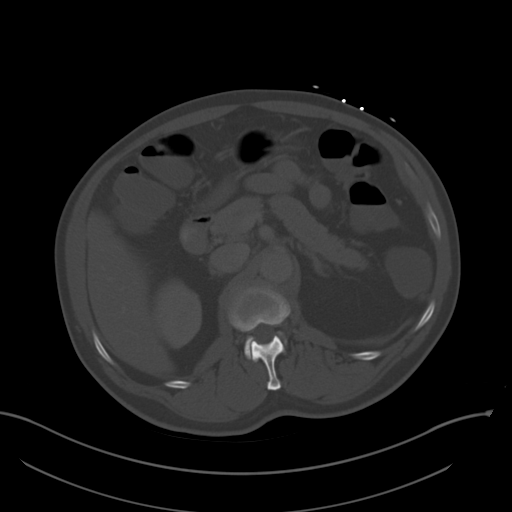
[im 83/106  soft-tissue]
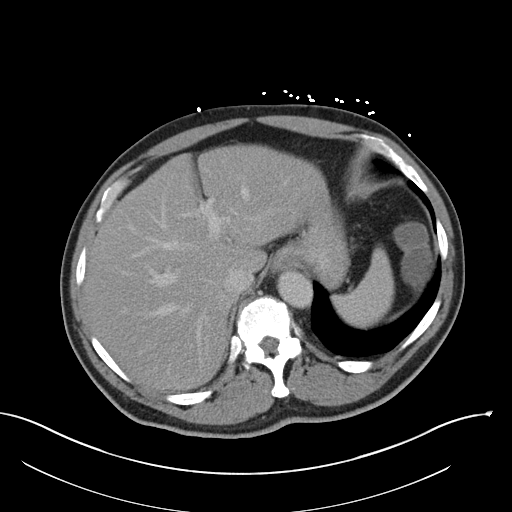
[im 83/106  lung]
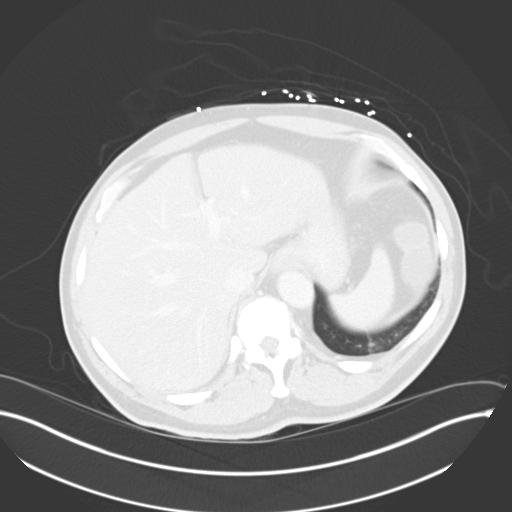
[im 89/106  soft-tissue]
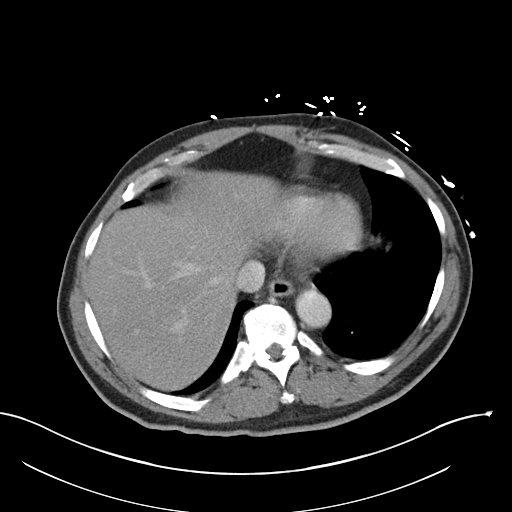
[im 89/106  lung]
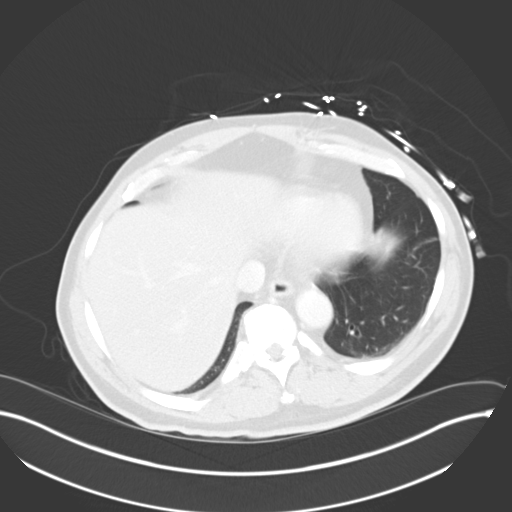
[im 94/106  lung]
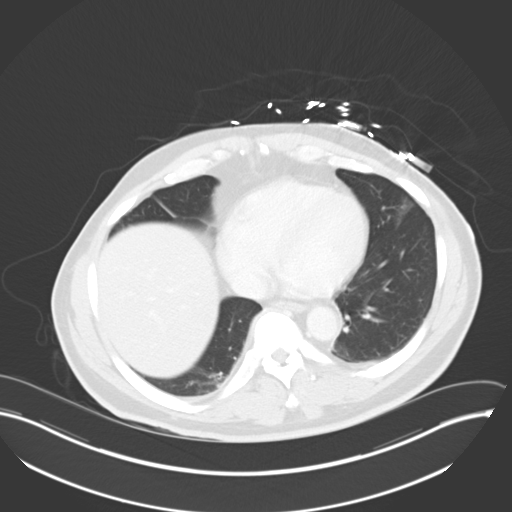
[im 100/106  soft-tissue]
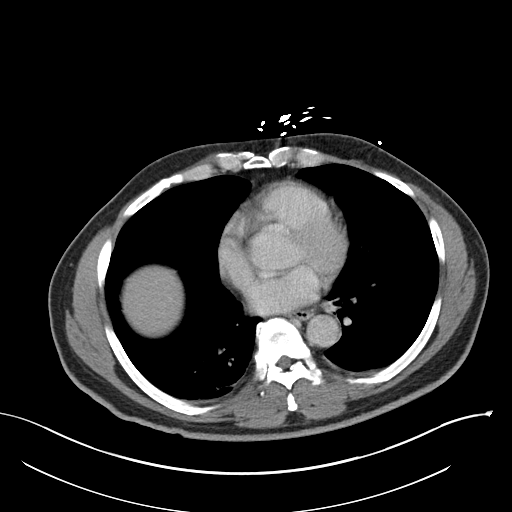
[im 100/106  lung]
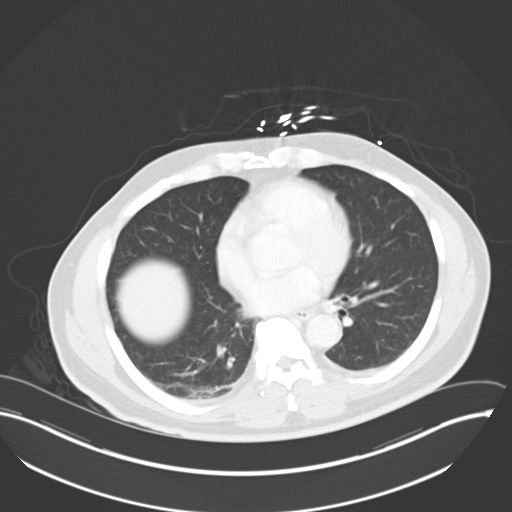

[13 of 32 positions shown; findings below may reference images not displayed]

FINDINGS: There is stable mild atelectasis or scarring in both lung bases. No
significant pleural or pericardial effusion is present. There is a
small hiatal hernia.

Hepatic steatosis appears mildly improved. There is no focal hepatic
abnormality. The spleen, gallbladder, pancreas, adrenal glands and
kidneys appear normal. There is no hydronephrosis.

The stomach and small bowel appear normal without significant
distention or wall thickening. The colon is diffusely fluid filled,
but shows no wall thickening or surrounding inflammation. The
appendix appears normal.

Foley catheter has been removed. There is bladder wall thickening
with mild residual perivesical soft tissue stranding. Ill-defined
fluid collection within the prostatectomy bed remains partially
obscured by artifact from the left total hip arthroplasty, but
appears improved, measuring approximately in 4.0 x 2.5 cm. Fluid
collections posterior to both external iliac arteries also appear
slightly smaller and better defined, measuring 3.3 x 2.1 cm on the
right and 3.4 x 1.6 cm on the left. There is minimal residual air
within the right-sided collection. There is no evidence of iliac
vein DVT. Edematous changes within the perineum and upper scrotum
appear improved compared with the recent study.
IMPRESSION: 1. Evolving postoperative pelvic fluid collections, likely seromas
or lymphoceles. The perineal and perivesical soft tissue stranding
noted previously have improved.
2. Fluid-filled colon, likely ileus. No evidence of bowel
obstruction or new extraluminal fluid collection.
3. No acute abdominal findings.  Mild hepatic steatosis.

## 2015-06-12 ENCOUNTER — Ambulatory Visit (INDEPENDENT_AMBULATORY_CARE_PROVIDER_SITE_OTHER): Payer: 59 | Admitting: Internal Medicine

## 2015-06-12 ENCOUNTER — Encounter: Payer: Self-pay | Admitting: Internal Medicine

## 2015-06-12 VITALS — BP 150/100 | HR 78 | Temp 98.1°F | Ht 70.0 in | Wt 214.0 lb

## 2015-06-12 DIAGNOSIS — IMO0001 Reserved for inherently not codable concepts without codable children: Secondary | ICD-10-CM

## 2015-06-12 DIAGNOSIS — R030 Elevated blood-pressure reading, without diagnosis of hypertension: Secondary | ICD-10-CM

## 2015-06-12 DIAGNOSIS — M159 Polyosteoarthritis, unspecified: Secondary | ICD-10-CM

## 2015-06-12 DIAGNOSIS — M150 Primary generalized (osteo)arthritis: Secondary | ICD-10-CM | POA: Diagnosis not present

## 2015-06-12 MED ORDER — LISINOPRIL-HYDROCHLOROTHIAZIDE 20-12.5 MG PO TABS: 1.0000 | ORAL_TABLET | Freq: Every day | ORAL | Status: DC

## 2015-06-12 MED ORDER — TRAMADOL HCL 50 MG PO TABS: 50.0000 mg | ORAL_TABLET | Freq: Four times a day (QID) | ORAL | Status: AC | PRN

## 2015-06-12 MED ORDER — LISINOPRIL-HYDROCHLOROTHIAZIDE 20-12.5 MG PO TABS: 1.0000 | ORAL_TABLET | Freq: Every day | ORAL | Status: AC

## 2015-06-12 NOTE — Progress Notes (Signed)
Subjective:    Patient ID: [name], male    DOB: 05-25-1951, 64 y.o.   MRN: [id number]  HPI  BP Readings from Last 3 Encounters:  06/12/15 150/100  07/02/14 138/90  04/26/14 52/7   64 year old patient who has a history of osteoarthritis as well as alcoholism.  He is scheduled for elective right hip surgery on February 13 of 2017.  He also complains of left shoulder pain.  He is followed by orthopedics.  Due to pain.  He has been taking Advil, and blood pressure readings have been elevated.  He does have a history of elevated blood pressure readings in the past He has a history of alcoholism and when asked about alcohol intake.  He states "not really".  He does state that he has consumed some wine to help control arthritis pain.  He is involved in [place] and has a sponsor in [place] where he now lives He has been prescribed metoprolol and hydralazine in the past, which he no longer takes  Past Medical History  Diagnosis Date  . ALLERGIC RHINITIS 04/19/2009  . ASTHMA 04/19/2009  . COLONIC POLYPS, HX OF 04/19/2009  . OSTEOARTHRITIS 04/19/2009  . Eczema     Social History   Social History  . Marital Status: Divorced    Spouse Name: N/A  . Number of Children: N/A  . Years of Education: N/A   Occupational History  . Not on file.   Social History Main Topics  . Smoking status: Former Smoker -- 1.00 packs/day    Quit date: 07/03/2011  . Smokeless tobacco: Never Used  . Alcohol Use: No  . Drug Use: No  . Sexual Activity: No   Other Topics Concern  . Not on file   Social History Narrative    Past Surgical History  Procedure Laterality Date  . Joint replacement      left hip  . Prostatectomy      September 14, 2013    No family history on file.  Allergies  Allergen Reactions  . Horse-Derived Products Anaphylaxis  . Meperidine Nausea And Vomiting    "patient states that he felt like he was blacking out and couldn't wake up.    Current Outpatient Prescriptions on  File Prior to Visit  Medication Sig Dispense Refill  . albuterol (PROAIR HFA) 108 (90 BASE) MCG/ACT inhaler INHALE 2 PUFFS INTO THE LUNGS EVERY 6 HOURS AS NEEDED FOR WHEEZING 8.5 each 2  . famotidine (PEPCID) 20 MG tablet Take 1 tablet (20 mg total) by mouth 2 (two) times daily. 60 tablet 1  . hydrALAZINE (APRESOLINE) 10 MG tablet Take 1 tablet (10 mg total) by mouth every 8 (eight) hours. 90 tablet 0  . hydrOXYzine (ATARAX/VISTARIL) 25 MG tablet Take 25 mg by mouth 3 (three) times daily as needed for itching.    . hydrOXYzine (ATARAX/VISTARIL) 25 MG tablet USE AS DIRECTED FOR ITCHING 60 tablet 1  . hydrOXYzine (ATARAX/VISTARIL) 25 MG tablet USE AS DIRECTED FOR ITCHING 60 tablet 1  . hydrOXYzine (ATARAX/VISTARIL) 25 MG tablet USE AS DIRECTED FOR ITCHING 60 tablet 0  . hydrOXYzine (ATARAX/VISTARIL) 25 MG tablet USE AS DIRECTED FOR ITCHING 60 tablet 1  . metoprolol succinate (TOPROL-XL) 50 MG 24 hr tablet Take 1 tablet (50 mg total) by mouth daily. 30 tablet 1  . Multiple Vitamin (MULTIVITAMIN WITH MINERALS) TABS tablet Take 1 tablet by mouth daily.    . Omega-3 Fatty Acids (FISH OIL) 1000 MG CAPS Take 2 capsules by mouth  daily.    . Potassium 99 MG TABS Take 2 tablets by mouth daily.    . tadalafil (CIALIS) 5 MG tablet Take 1 tablet (5 mg total) by mouth daily as needed for erectile dysfunction. Take 1 tablet 5 days per week 30 tablet 4  . thiamine (VITAMIN B-1) 100 MG tablet Take 100 mg by mouth daily.    Marland Kitchen triamcinolone cream (KENALOG) 0.1 % Apply 1 application topically 2 (two) times daily as needed (rash / irritaion).     . vitamin C (ASCORBIC ACID) 500 MG tablet Take 500 mg by mouth daily.    Marland Kitchen ibuprofen (ADVIL,MOTRIN) 200 MG tablet Take 400 mg by mouth every 6 (six) hours as needed for moderate pain. Reported on 06/12/2015     No current facility-administered medications on file prior to visit.    BP 150/100 mmHg  Pulse 78  Temp(Src) 98.1 F (36.7 C) (Oral)  Ht 5\' 10"  (1.778 m)  Wt  214 lb (97.07 kg)  BMI 30.71 kg/m2  SpO2 98%      Review of Systems  Constitutional: Negative for fever, chills, appetite change and fatigue.  HENT: Negative for congestion, dental problem, ear pain, hearing loss, sore throat, tinnitus, trouble swallowing and voice change.   Eyes: Negative for pain, discharge and visual disturbance.  Respiratory: Negative for cough, chest tightness, wheezing and stridor.   Cardiovascular: Negative for chest pain, palpitations and leg swelling.  Gastrointestinal: Negative for nausea, vomiting, abdominal pain, diarrhea, constipation, blood in stool and abdominal distention.  Genitourinary: Negative for urgency, hematuria, flank pain, discharge, difficulty urinating and genital sores.  Musculoskeletal: Positive for arthralgias, neck pain and neck stiffness. Negative for myalgias, back pain, joint swelling and gait problem.  Skin: Negative for rash.  Neurological: Negative for dizziness, syncope, speech difficulty, weakness, numbness and headaches.  Hematological: Negative for adenopathy. Does not bruise/bleed easily.  Psychiatric/Behavioral: Negative for behavioral problems and dysphoric mood. The patient is not nervous/anxious.        Objective:   Physical Exam  Constitutional: He is oriented to person, place, and time. He appears well-developed.  Blood pressure 150/100  HENT:  Head: Normocephalic.  Right Ear: External ear normal.  Left Ear: External ear normal.  Eyes: Conjunctivae and EOM are normal.  Neck: Normal range of motion.  Cardiovascular: Normal rate and normal heart sounds.   Pulmonary/Chest: Breath sounds normal.  Abdominal: Bowel sounds are normal.  Musculoskeletal: Normal range of motion. He exhibits no edema or tenderness.  Full range of motion left shoulder  Neurological: He is alert and oriented to person, place, and time.  Psychiatric: He has a normal mood and affect. His behavior is normal.          Assessment & Plan:     Osteoarthritis right hip Left shoulder pain.  Follow-up orthopedics Hypertension.  Will place on a DASH eating plan.  In view of his upcoming surgery.  We'll place on antihypertensive medications today.  Recheck 4 weeks Alcoholism.  Total abstinence encouraged

## 2015-06-12 NOTE — Progress Notes (Signed)
Pre visit review using our clinic review tool, if applicable. No additional management support is needed unless otherwise documented below in the visit note. 

## 2015-06-12 NOTE — Patient Instructions (Addendum)
Limit your sodium (Salt) intake  Return in one month for follow-up  Please check your blood pressure on a regular basis.  If it is consistently greater than 150/90, please make an office appointment.   DASH Eating Plan DASH stands for "Dietary Approaches to Stop Hypertension." The DASH eating plan is a healthy eating plan that has been shown to reduce high blood pressure (hypertension). Additional health benefits may include reducing the risk of type 2 diabetes mellitus, heart disease, and stroke. The DASH eating plan may also help with weight loss. WHAT DO I NEED TO KNOW ABOUT THE DASH EATING PLAN? For the DASH eating plan, you will follow these general guidelines:  Choose foods with a percent daily value for sodium of less than 5% (as listed on the food label).  Use salt-free seasonings or herbs instead of table salt or sea salt.  Check with your health care provider or pharmacist before using salt substitutes.  Eat lower-sodium products, often labeled as "lower sodium" or "no salt added."  Eat fresh foods.  Eat more vegetables, fruits, and low-fat dairy products.  Choose whole grains. Look for the word "whole" as the first word in the ingredient list.  Choose fish and skinless chicken or [place] more often than red meat. Limit fish, poultry, and meat to 6 oz (170 g) each day.  Limit sweets, desserts, sugars, and sugary drinks.  Choose heart-healthy fats.  Limit cheese to 1 oz (28 g) per day.  Eat more home-cooked food and less restaurant, buffet, and fast food.  Limit fried foods.  Cook foods using methods other than frying.  Limit canned vegetables. If you do use them, rinse them well to decrease the sodium.  When eating at a restaurant, ask that your food be prepared with less salt, or no salt if possible. WHAT FOODS CAN I EAT? Seek help from a dietitian for individual calorie needs. Grains Whole grain or whole wheat bread. Brown rice. Whole grain or whole wheat  pasta. Quinoa, bulgur, and whole grain cereals. Low-sodium cereals. Corn or whole wheat flour tortillas. Whole grain cornbread. Whole grain crackers. Low-sodium crackers. Vegetables Fresh or frozen vegetables (raw, steamed, roasted, or grilled). Low-sodium or reduced-sodium tomato and vegetable juices. Low-sodium or reduced-sodium tomato sauce and paste. Low-sodium or reduced-sodium canned vegetables.  Fruits All fresh, canned (in natural juice), or frozen fruits. Meat and Other Protein Products Ground beef (85% or leaner), grass-fed beef, or beef trimmed of fat. Skinless chicken or [place]. Ground chicken or [place]. Pork trimmed of fat. All fish and seafood. Eggs. Dried beans, peas, or lentils. Unsalted nuts and seeds. Unsalted canned beans. Dairy Low-fat dairy products, such as skim or 1% milk, 2% or reduced-fat cheeses, low-fat ricotta or cottage cheese, or plain low-fat yogurt. Low-sodium or reduced-sodium cheeses. Fats and Oils Tub margarines without trans fats. Light or reduced-fat mayonnaise and salad dressings (reduced sodium). Avocado. Safflower, olive, or canola oils. Natural peanut or almond butter. Other Unsalted popcorn and pretzels. The items listed above may not be a complete list of recommended foods or beverages. Contact your dietitian for more options. WHAT FOODS ARE NOT RECOMMENDED? Grains White bread. White pasta. White rice. Refined cornbread. Bagels and croissants. Crackers that contain trans fat. Vegetables Creamed or fried vegetables. Vegetables in a cheese sauce. Regular canned vegetables. Regular canned tomato sauce and paste. Regular tomato and vegetable juices. Fruits Dried fruits. Canned fruit in light or heavy syrup. Fruit juice. Meat and Other Protein Products Fatty cuts of meat. Ribs, chicken  wings, bacon, sausage, bologna, salami, chitterlings, fatback, hot dogs, bratwurst, and packaged luncheon meats. Salted nuts and seeds. Canned beans with salt. Dairy Whole  or 2% milk, cream, half-and-half, and cream cheese. Whole-fat or sweetened yogurt. Full-fat cheeses or blue cheese. Nondairy creamers and whipped toppings. Processed cheese, cheese spreads, or cheese curds. Condiments Onion and garlic salt, seasoned salt, table salt, and sea salt. Canned and packaged gravies. Worcestershire sauce. Tartar sauce. Barbecue sauce. Teriyaki sauce. Soy sauce, including reduced sodium. Steak sauce. Fish sauce. Oyster sauce. Cocktail sauce. Horseradish. Ketchup and mustard. Meat flavorings and tenderizers. Bouillon cubes. Hot sauce. Tabasco sauce. Marinades. Taco seasonings. Relishes. Fats and Oils Butter, stick margarine, lard, shortening, ghee, and bacon fat. Coconut, palm kernel, or palm oils. Regular salad dressings. Other Pickles and olives. Salted popcorn and pretzels. The items listed above may not be a complete list of foods and beverages to avoid. Contact your dietitian for more information. WHERE CAN I FIND MORE INFORMATION? National Heart, Lung, and Blood Institute: travelstabloid.com   This information is not intended to replace advice given to you by your health care provider. Make sure you discuss any questions you have with your health care provider.   Document Released: 06/04/2011 Document Revised: 07/06/2014 Document Reviewed: 04/19/2013 Elsevier Interactive Patient Education [employer].

## 2015-06-17 ENCOUNTER — Telehealth: Payer: Self-pay | Admitting: Internal Medicine

## 2015-06-17 NOTE — Telephone Encounter (Signed)
Please see message and advise 

## 2015-06-17 NOTE — Telephone Encounter (Signed)
Please ask patient to contact his orthopedic physician Who will x-ray the shoulder and further evaluate and manage

## 2015-06-17 NOTE — Telephone Encounter (Signed)
Pt states he was seen in the office last week for left shoulder pain.  He was given medication for the pain but it is not helping.  Pt is requesting order to have x-ray of shoulder.

## 2015-06-18 NOTE — Telephone Encounter (Signed)
Spoke to [name], told him Dr. Raliegh Ip said to contact his orthopedic physician Who will x-ray the shoulder and further evaluate and manage. [name] verbalized understanding.

## 2015-07-15 ENCOUNTER — Ambulatory Visit: Payer: 59 | Admitting: Internal Medicine

## 2015-07-20 ENCOUNTER — Other Ambulatory Visit: Payer: Self-pay | Admitting: Internal Medicine

## 2015-12-04 ENCOUNTER — Other Ambulatory Visit: Payer: Self-pay | Admitting: *Deleted

## 2015-12-04 ENCOUNTER — Other Ambulatory Visit: Payer: Self-pay | Admitting: Internal Medicine

## 2016-01-03 ENCOUNTER — Other Ambulatory Visit: Payer: Self-pay | Admitting: *Deleted

## 2016-01-03 MED ORDER — HYDROXYZINE HCL 25 MG PO TABS: ORAL_TABLET | ORAL | Status: AC

## 2016-02-28 ENCOUNTER — Other Ambulatory Visit: Payer: Self-pay | Admitting: Internal Medicine

## 2016-05-15 ENCOUNTER — Telehealth: Payer: Self-pay | Admitting: *Deleted

## 2016-05-15 NOTE — Telephone Encounter (Signed)
Spoke to pt, told him received letter from pharmacy that your inhaler is not covered by insurance. Pt said yes, he was told at the pharmacy. Asked pt if he knows which inhaler is covered? Pt said no. Told him need to contact insurance and ask what inhalers are covered and let me know so we can change the order. Pt verbalized understanding.

## 2016-05-29 ENCOUNTER — Other Ambulatory Visit: Payer: Self-pay | Admitting: Internal Medicine

## 2016-05-29 DEATH — deceased

## 2016-07-02 ENCOUNTER — Other Ambulatory Visit: Payer: Self-pay

## 2016-07-06 ENCOUNTER — Encounter (INDEPENDENT_AMBULATORY_CARE_PROVIDER_SITE_OTHER): Payer: Self-pay | Admitting: Internal Medicine

## 2016-07-06 ENCOUNTER — Telehealth: Payer: Self-pay | Admitting: Internal Medicine

## 2016-07-06 DIAGNOSIS — Z0289 Encounter for other administrative examinations: Secondary | ICD-10-CM

## 2016-07-06 NOTE — Telephone Encounter (Signed)
Pt missed appointment today. Called pt to check if he is ok. No answer, a detailed message was left on his voicemail to return call to office.
